# Patient Record
Sex: Female | Born: 1970 | Race: Black or African American | Hispanic: No | Marital: Married | Smoking: Never smoker
Health system: Southern US, Community
[De-identification: ages and names within clinical notes are randomized; demographics above are authoritative.]

## PROBLEM LIST (undated history)

## (undated) DIAGNOSIS — A6000 Herpesviral infection of urogenital system, unspecified: Secondary | ICD-10-CM

## (undated) DIAGNOSIS — D649 Anemia, unspecified: Secondary | ICD-10-CM

## (undated) DIAGNOSIS — I491 Atrial premature depolarization: Secondary | ICD-10-CM

## (undated) DIAGNOSIS — I10 Essential (primary) hypertension: Secondary | ICD-10-CM

## (undated) DIAGNOSIS — R7301 Impaired fasting glucose: Secondary | ICD-10-CM

## (undated) DIAGNOSIS — E785 Hyperlipidemia, unspecified: Secondary | ICD-10-CM

## (undated) DIAGNOSIS — E119 Type 2 diabetes mellitus without complications: Secondary | ICD-10-CM

## (undated) DIAGNOSIS — M419 Scoliosis, unspecified: Secondary | ICD-10-CM

## (undated) DIAGNOSIS — K219 Gastro-esophageal reflux disease without esophagitis: Secondary | ICD-10-CM

## (undated) HISTORY — PX: BACK SURGERY: SHX140

## (undated) HISTORY — DX: Hyperlipidemia, unspecified: E78.5

## (undated) HISTORY — DX: Essential (primary) hypertension: I10

## (undated) HISTORY — DX: Herpesviral infection of urogenital system, unspecified: A60.00

## (undated) HISTORY — DX: Atrial premature depolarization: I49.1

## (undated) HISTORY — DX: Morbid (severe) obesity due to excess calories: E66.01

## (undated) HISTORY — DX: Anemia, unspecified: D64.9

## (undated) HISTORY — DX: Impaired fasting glucose: R73.01

## (undated) HISTORY — DX: Scoliosis, unspecified: M41.9

## (undated) HISTORY — PX: COLONOSCOPY: SHX174

## (undated) HISTORY — PX: TUBAL LIGATION: SHX77

---

## 1999-01-09 ENCOUNTER — Other Ambulatory Visit: Admission: RE | Admit: 1999-01-09 | Discharge: 1999-01-09 | Payer: Self-pay | Admitting: *Deleted

## 1999-07-13 ENCOUNTER — Inpatient Hospital Stay (HOSPITAL_COMMUNITY): Admission: AD | Admit: 1999-07-13 | Discharge: 1999-07-13 | Payer: Self-pay | Admitting: Obstetrics & Gynecology

## 1999-07-21 ENCOUNTER — Encounter (INDEPENDENT_AMBULATORY_CARE_PROVIDER_SITE_OTHER): Payer: Self-pay

## 1999-07-21 ENCOUNTER — Inpatient Hospital Stay (HOSPITAL_COMMUNITY): Admission: AD | Admit: 1999-07-21 | Discharge: 1999-07-23 | Payer: Self-pay | Admitting: *Deleted

## 2000-11-18 ENCOUNTER — Emergency Department (HOSPITAL_COMMUNITY): Admission: EM | Admit: 2000-11-18 | Discharge: 2000-11-18 | Payer: Self-pay | Admitting: Emergency Medicine

## 2000-11-18 ENCOUNTER — Encounter: Payer: Self-pay | Admitting: Emergency Medicine

## 2001-02-06 ENCOUNTER — Encounter: Payer: Self-pay | Admitting: Emergency Medicine

## 2001-02-06 ENCOUNTER — Emergency Department (HOSPITAL_COMMUNITY): Admission: EM | Admit: 2001-02-06 | Discharge: 2001-02-06 | Payer: Self-pay

## 2001-10-16 ENCOUNTER — Other Ambulatory Visit: Admission: RE | Admit: 2001-10-16 | Discharge: 2001-10-16 | Payer: Self-pay | Admitting: Obstetrics and Gynecology

## 2002-10-22 ENCOUNTER — Other Ambulatory Visit: Admission: RE | Admit: 2002-10-22 | Discharge: 2002-10-22 | Payer: Self-pay | Admitting: *Deleted

## 2003-06-13 ENCOUNTER — Emergency Department (HOSPITAL_COMMUNITY): Admission: EM | Admit: 2003-06-13 | Discharge: 2003-06-13 | Payer: Self-pay | Admitting: Emergency Medicine

## 2004-08-18 ENCOUNTER — Emergency Department (HOSPITAL_COMMUNITY): Admission: EM | Admit: 2004-08-18 | Discharge: 2004-08-18 | Payer: Self-pay | Admitting: Emergency Medicine

## 2005-09-26 ENCOUNTER — Other Ambulatory Visit: Admission: RE | Admit: 2005-09-26 | Discharge: 2005-09-26 | Payer: Self-pay | Admitting: Obstetrics and Gynecology

## 2008-05-20 ENCOUNTER — Emergency Department (HOSPITAL_COMMUNITY): Admission: EM | Admit: 2008-05-20 | Discharge: 2008-05-20 | Payer: Self-pay | Admitting: *Deleted

## 2008-07-21 ENCOUNTER — Ambulatory Visit (HOSPITAL_BASED_OUTPATIENT_CLINIC_OR_DEPARTMENT_OTHER): Admission: RE | Admit: 2008-07-21 | Discharge: 2008-07-21 | Payer: Self-pay | Admitting: Family Medicine

## 2008-07-21 ENCOUNTER — Ambulatory Visit: Payer: Self-pay | Admitting: Radiology

## 2008-12-11 ENCOUNTER — Emergency Department (HOSPITAL_COMMUNITY): Admission: EM | Admit: 2008-12-11 | Discharge: 2008-12-11 | Payer: Self-pay | Admitting: Family Medicine

## 2010-12-01 NOTE — H&P (Signed)
[facility]  Patient:    Emily Fischer                    MRN: [id number] Adm. Date:  [id number] Attending:  Shaune Spittle Dictator:   Erin Sons, C.N.M.                         History and Physical  HISTORY OF PRESENT ILLNESS:   Ms. Crain is a 40 year old gravida 2, para 1-0-0-1 at 38-4/7 weeks, who presents with uterine contractions every five to seven minutes for approximately two hours.  She was seen at [facility] earlier today with the cervix 3 cm.  She reported increased uterine contractions once she left the office.  She denied any leaking or bleeding.  Upon admission to the Maternity Admissions Department, uterine contractions were every three to five minutes, mild-to-moderate quality, cervix was unchanged, fetal heart rate was reactive.  She was ambulated for approximately one to one and one-half hours. hen she returned from walking, her cervix had changed to 4 cm, 70%, vertex at a -1 o -2 station.  She was admitted for labor care per consult with Dr. Maris Berger. Haygood.  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  PRENATAL LABORATORY DATA:     Blood type is A-positive.  Rh-antibody negative.  VDRL nonreactive.  Rubella titer positive.  Hepatitis B surface antigen negative. HIV nonreactive.  Sickle cell test negative.  GC and Chlamydia cultures were negative.  Pap was normal.  Glucose challenge was normal.  AFP was normal. Group B strep culture at 36 weeks was negative.  EDC of July 31, 1999 was established by last menstrual period and was in agreement with ultrasound at approximately 17 weeks.  Hemoglobin upon entry into practice was 11.3; it was 10.2 at 27 weeks.   HISTORY OF PRESENT PREGNANCY:   Patient entered care at approximately 8 weeks. he had issues with heartburn throughout her pregnancy; she was on Zantac.  She desired a tubal ligation and spoke with Dr. Georgina Peer during her pregnancy regarding that.  She was decreased to working 30 hours per week at approximately 23 weeks  secondary to uterine contractions.  She had an ultrasound at 27 weeks secondary to size greater than dates.  Infant was in the 97th percentile.  Cervix was 3.5 cm and other findings were normal.  She was placed on iron at that time for 10.2 hemoglobin.  She had an ultrasound again at 36 weeks with estimated fetal weight in the 63rd percentile with normal AFI.  The rest of her pregnancy was essentially  uncomplicated.  OBSTETRICAL HISTORY:          In July of 1999, she had a vaginal birth of a female infant, weight 7 pounds 11 ounces, at 39-weeks gestation.  She was in labor approximately six hours.  She was induced at 39 weeks due to swelling and PIH. She was out of work at 35 to 36 weeks.  She had approximately 60-pound weight gain.  She had anemia during that pregnancy.  She also had postpartum depression following that pregnancy.  She did have an episode of preterm labor at 65 to 34 weeks and was treated with bedrest.  MEDICAL HISTORY:              She was on Loestrin for five years, from 66 to 19  years of age; she then took it also from August of 1999 till December of 1999 following the birth of her child.  She had an abnormal Pap in 1994, with a repeat that was normal.  She had her first outbreak of HSV in 1994 and that was the only outbreak she has ever had.  She reports the usual childhood illnesses.  Patient has a history of PACs and was treated with potassium and stress reduction and decreased caffeine; she has had no other complications.  She had a history of some superficial varicosities and has a history of chronic anemia.  She had scoliosis surgery in 1987.  She had  a UTI x 1 as a teenager.  Her only other hospitalization was for childbirth in 1999.  ALLERGIES:                    None.  FAMILY HISTORY:               Patients sister had PIH.  Her mother has hypertension.  Her paternal grandmother has hypertension.  Her ______ has hypertension.  Her sister has varicose veins.  Her maternal grandfather had some type of cancer.  Father of the baby has a previous history of cocaine use. Maternal uncle is a heroin abuser.  Sister abuses marijuana.  GENETIC HISTORY:              Genetic history is remarkable for the father of the baby having sickle cell trait, patients first child also has sickle cell trait nd the paternal father has sickle cell trait.  Patients ______ sisters child was born with too much protein in its body and no ability to fight it off; there is  some type of metabolic disease.  Father of the babys father is an alcoholic.  SOCIAL HISTORY:               Patient is married to the father of the baby; he s involved and supportive.  His name is Delight Stare, Montez Hageman.  She is African-American and of the [place] faith.  She has been followed by the certified nurse midwife service at [facility].  She denies any alcohol, drug or tobacco use during this pregnancy.  PHYSICAL EXAMINATION:  VITAL SIGNS:                  Vital signs are stable.  Patient is afebrile.  HEENT:                        Within normal limits.  LUNGS:                        Bilateral breath sounds are clear.  HEART:                        Regular rate and rhythm, without murmur.  BREASTS:                      Soft and nontender.  ABDOMEN:                      Fundal height is approximately 40 cm.  Estimated fetal weight is 8 to 8-1/2 pounds.  Uterine contractions are  every four minutes, mild-to-moderate quality.  PELVIC:                       Cervical exam 4 cm, 75%, vertex at a -1 to -2 station with bulging bag of water.  Fetal heart rate is  reactive.  EXTREMITIES:                  Deep tendon reflexes are 2+ without clonus. There is a trace edema noted.  IMPRESSION:                   1. Intrauterine pregnancy at 38-4/7 weeks.                               2. Early labor.                                3. History of herpes simplex virus -- no evidence of                                  outbreak.                               4. Plans bilateral tubal ligation.  PLAN:                         1. Admit to birthing suite per consult with                                  Dr. Maris Berger. Haygood as attending physician.                               2. Routine certified nurse midwife orders.                               3. Anticipate artificial rupture of membranes and                                  labor augmentation p.r.n.                               4. Patient desires tubal ligation postpartum. DD:  07/21/99 TD:  07/22/99 Job: [id number]

## 2011-07-18 LAB — HM PAP SMEAR

## 2011-11-23 ENCOUNTER — Encounter: Payer: Self-pay | Admitting: *Deleted

## 2012-11-06 ENCOUNTER — Ambulatory Visit (INDEPENDENT_AMBULATORY_CARE_PROVIDER_SITE_OTHER): Payer: 59 | Admitting: Family Medicine

## 2012-11-06 ENCOUNTER — Encounter: Payer: Self-pay | Admitting: Family Medicine

## 2012-11-06 VITALS — BP 156/97 | HR 66 | Ht 63.0 in | Wt 245.0 lb

## 2012-11-06 DIAGNOSIS — R300 Dysuria: Secondary | ICD-10-CM

## 2012-11-06 LAB — POCT URINALYSIS DIPSTICK
Bilirubin, UA: NEGATIVE
Blood, UA: NEGATIVE
Glucose, UA: NEGATIVE
Ketones, UA: NEGATIVE
Leukocytes, UA: NEGATIVE
Nitrite, UA: NEGATIVE
Protein, UA: NEGATIVE
Spec Grav, UA: 1.01
Urobilinogen, UA: NEGATIVE
pH, UA: 7

## 2012-11-06 MED ORDER — METRONIDAZOLE 500 MG PO TABS: 500.0000 mg | ORAL_TABLET | Freq: Three times a day (TID) | ORAL | Status: AC

## 2012-11-06 MED ORDER — METRONIDAZOLE 0.75 % VA GEL: 1.0000 | Freq: Two times a day (BID) | VAGINAL | Status: DC

## 2012-11-06 MED ORDER — FAMCICLOVIR 500 MG PO TABS: ORAL_TABLET | ORAL | Status: AC

## 2012-11-06 MED ORDER — FLUCONAZOLE 200 MG PO TABS: 200.0000 mg | ORAL_TABLET | Freq: Every day | ORAL | Status: DC

## 2012-11-06 MED ORDER — TERCONAZOLE 0.8 % VA CREA: 1.0000 | TOPICAL_CREAM | Freq: Every day | VAGINAL | Status: DC

## 2012-11-06 NOTE — Patient Instructions (Addendum)
1)  Vaginal Itching vs Irritation - If you are itching then you most likely have a yeast infection which you would treat with the Terazol and Diflucan.  If you are more irritated then you would treat for BV with Flagyl or Metrogel.  You can prevent this by eating yogurt (acidolphilus) and using boric acid suppositories vs boric acid douche (1-3 teaspoon per bottle)   2)  HSV 2 - Take 1/2 - 1 of the Valtrex daily for prevention then 3 at once of the Famvir for treatment.     Bacterial Vaginosis Bacterial vaginosis (BV) is a vaginal infection where the normal balance of bacteria in the vagina is disrupted. The normal balance is then replaced by an overgrowth of certain bacteria. There are several different kinds of bacteria that can cause BV. BV is the most common vaginal infection in women of childbearing age. CAUSES   The cause of BV is not fully understood. BV develops when there is an increase or imbalance of harmful bacteria.  Some activities or behaviors can upset the normal balance of bacteria in the vagina and put women at increased risk including:  Having a new sex partner or multiple sex partners.  Douching.  Using an intrauterine device (IUD) for contraception.  It is not clear what role sexual activity plays in the development of BV. However, women that have never had sexual intercourse are rarely infected with BV. Women do not get BV from toilet seats, bedding, swimming pools or from touching objects around them.  SYMPTOMS   Grey vaginal discharge.  A fish-like odor with discharge, especially after sexual intercourse.  Itching or burning of the vagina and vulva.  Burning or pain with urination.  Some women have no signs or symptoms at all. DIAGNOSIS  Your caregiver must examine the vagina for signs of BV. Your caregiver will perform lab tests and look at the sample of vaginal fluid through a microscope. They will look for bacteria and abnormal cells (clue cells), a pH test  higher than 4.5, and a positive amine test all associated with BV.  RISKS AND COMPLICATIONS   Pelvic inflammatory disease (PID).  Infections following gynecology surgery.  Developing HIV.  Developing herpes virus. TREATMENT  Sometimes BV will clear up without treatment. However, all women with symptoms of BV should be treated to avoid complications, especially if gynecology surgery is planned. Female partners generally do not need to be treated. However, BV may spread between female sex partners so treatment is helpful in preventing a recurrence of BV.   BV may be treated with antibiotics. The antibiotics come in either pill or vaginal cream forms. Either can be used with nonpregnant or pregnant women, but the recommended dosages differ. These antibiotics are not harmful to the baby.  BV can recur after treatment. If this happens, a second round of antibiotics will often be prescribed.  Treatment is important for pregnant women. If not treated, BV can cause a premature delivery, especially for a pregnant woman who had a premature birth in the past. All pregnant women who have symptoms of BV should be checked and treated.  For chronic reoccurrence of BV, treatment with a type of prescribed gel vaginally twice a week is helpful. HOME CARE INSTRUCTIONS   Finish all medication as directed by your caregiver.  Do not have sex until treatment is completed.  Tell your sexual partner that you have a vaginal infection. They should see their caregiver and be treated if they have problems, such  as a mild rash or itching.  Practice safe sex. Use condoms. Only have 1 sex partner. PREVENTION  Basic prevention steps can help reduce the risk of upsetting the natural balance of bacteria in the vagina and developing BV:  Do not have sexual intercourse (be abstinent).  Do not douche.  Use all of the medicine prescribed for treatment of BV, even if the signs and symptoms go away.  Tell your sex  partner if you have BV. That way, they can be treated, if needed, to prevent reoccurrence. SEEK MEDICAL CARE IF:   Your symptoms are not improving after 3 days of treatment.  You have increased discharge, pain, or fever. MAKE SURE YOU:   Understand these instructions.  Will watch your condition.  Will get help right away if you are not doing well or get worse. FOR MORE INFORMATION  Division of STD Prevention (DSTDP), Centers for Disease Control and Prevention: SolutionApps.[employer].za American Social Health Association (ASHA): www.ashastd.org  Document Released: 07/02/2005 Document Revised: 09/24/2011 Document Reviewed: 12/23/2008 [facility] Patient Information 2013 [place], [place].

## 2012-11-06 NOTE — Progress Notes (Signed)
Subjective:     Patient ID: Emily Fischer, female   DOB: 10/03/1970, 42 y.o.   MRN: [id number]  HPI Emily Fischer is here today complaining of vaginal irritation that she has been having for the past week.  She has thought that she was getting a herpes outbreak but her symptoms have persisted even though she has taken her Valtrex.  She also says that this irritation feels different than her previous herpes outbreaks.  She describes her symptoms as being moderate in nature and she is not improving.  She notices that she has increased discharge and an odor near the end of her period.    Review of Systems  Constitutional: Negative for fever.  Genitourinary: Positive for dysuria, vaginal discharge and vaginal pain. Negative for vaginal bleeding, difficulty urinating, genital sores, menstrual problem and dyspareunia.  Skin: Positive for rash.   Past Medical History  Diagnosis Date  . HTN (hypertension)   . Genital herpes   . Impaired fasting glucose   . Morbid obesity   . Hyperlipidemia   . Premature atrial contractions   . Anemia   . Scoliosis    Family History  Problem Relation Age of Onset  . Hypertension     . History   Social History Narrative   Married Chrissie Noa) "Bill"    Children:  Sons (LB, [name])    Pets: Dog Armed forces operational officer)    Living Situation: Lives with spouse and sons.     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 She appears distressed.  Abdominal: There is no tenderness.  Genitourinary: Pelvic exam was performed with patient supine. There is erythema and tenderness around the vagina. No bleeding around the vagina. No signs of injury around the vagina. Vaginal discharge found.   Musculoskeletal: She exhibits no edema and no tenderness.  Neurological: She is alert.  Psychiatric: She has a normal mood and affect.       Assessment:     Bacterial Vaginosisi HSV-2   Plan:     1)   We discussed the difference between yeast and BV and when she will use Metrogel vs Terazol and Diflucan. She is to use a boric acid douche toward the end of her period.    2)  HSV 2 - She will continue to take Valtrex for prevention and Famvir for treatment.

## 2012-11-09 ENCOUNTER — Encounter: Payer: Self-pay | Admitting: Family Medicine

## 2012-11-18 ENCOUNTER — Other Ambulatory Visit: Payer: Self-pay

## 2012-11-18 LAB — COMPLETE METABOLIC PANEL WITH GFR
ALT: 13 U/L (ref 0–35)
AST: 16 U/L (ref 0–37)
Albumin: 4.7 g/dL (ref 3.5–5.2)
Alkaline Phosphatase: 53 U/L (ref 39–117)
BUN: 8 mg/dL (ref 6–23)
CO2: 25 mEq/L (ref 19–32)
Calcium: 9.6 mg/dL (ref 8.4–10.5)
Chloride: 104 mEq/L (ref 96–112)
Creat: 0.66 mg/dL (ref 0.50–1.10)
GFR, Est African American: >89 mL/min
GFR, Est Non African American: >89 mL/min
Glucose, Bld: 100 mg/dL — ABNORMAL HIGH (ref 70–99)
Potassium: 4.7 mEq/L (ref 3.5–5.3)
Sodium: 137 mEq/L (ref 135–145)
Total Bilirubin: 0.5 mg/dL (ref 0.3–1.2)
Total Protein: 7.2 g/dL (ref 6.0–8.3)

## 2012-11-18 LAB — CBC WITH DIFFERENTIAL/PLATELET
Basophils Absolute: 0 10*3/uL (ref 0.0–0.1)
Basophils Relative: 0 % (ref 0–1)
Eosinophils Absolute: 0.1 10*3/uL (ref 0.0–0.7)
Eosinophils Relative: 2 % (ref 0–5)
HCT: 37.3 % (ref 36.0–46.0)
Hemoglobin: 11.8 g/dL — ABNORMAL LOW (ref 12.0–15.0)
Lymphocytes Relative: 28 % (ref 12–46)
Lymphs Abs: 1.5 10*3/uL (ref 0.7–4.0)
MCH: 23.7 pg — ABNORMAL LOW (ref 26.0–34.0)
MCHC: 31.6 g/dL (ref 30.0–36.0)
MCV: 74.9 fL — ABNORMAL LOW (ref 78.0–100.0)
Monocytes Absolute: 0.4 10*3/uL (ref 0.1–1.0)
Monocytes Relative: 8 % (ref 3–12)
Neutro Abs: 3.3 10*3/uL (ref 1.7–7.7)
Neutrophils Relative %: 62 % (ref 43–77)
Platelets: 328 10*3/uL (ref 150–400)
RBC: 4.98 MIL/uL (ref 3.87–5.11)
RDW: 16.7 % — ABNORMAL HIGH (ref 11.5–15.5)
WBC: 5.3 10*3/uL (ref 4.0–10.5)

## 2012-11-18 LAB — LIPID PANEL
Cholesterol: 192 mg/dL (ref 0–200)
HDL: 43 mg/dL (ref >39)
LDL Cholesterol: 135 mg/dL — ABNORMAL HIGH (ref 0–99)
Total CHOL/HDL Ratio: 4.5 Ratio
Triglycerides: 69 mg/dL (ref <150)
VLDL: 14 mg/dL (ref 0–40)

## 2012-11-18 LAB — HEMOGLOBIN A1C
Hgb A1c MFr Bld: 6 % — ABNORMAL HIGH (ref <5.7)
Mean Plasma Glucose: 126 mg/dL — ABNORMAL HIGH (ref <117)

## 2012-11-19 LAB — VITAMIN D 25 HYDROXY (VIT D DEFICIENCY, FRACTURES): Vit D, 25-Hydroxy: 35 ng/mL (ref 30–89)

## 2012-11-25 ENCOUNTER — Encounter: Payer: Self-pay | Admitting: Family Medicine

## 2012-11-25 ENCOUNTER — Ambulatory Visit (INDEPENDENT_AMBULATORY_CARE_PROVIDER_SITE_OTHER): Payer: 59 | Admitting: Family Medicine

## 2012-11-25 VITALS — BP 124/79 | HR 65 | Wt 249.0 lb

## 2012-11-25 DIAGNOSIS — I10 Essential (primary) hypertension: Secondary | ICD-10-CM | POA: Insufficient documentation

## 2012-11-25 DIAGNOSIS — E785 Hyperlipidemia, unspecified: Secondary | ICD-10-CM | POA: Insufficient documentation

## 2012-11-25 DIAGNOSIS — R7301 Impaired fasting glucose: Secondary | ICD-10-CM | POA: Insufficient documentation

## 2012-11-25 MED ORDER — ATENOLOL 100 MG PO TABS: 100.0000 mg | ORAL_TABLET | Freq: Every day | ORAL | Status: DC

## 2012-11-25 NOTE — Progress Notes (Signed)
Subjective:    Patient ID: Emily Fischer, female    DOB: 12/18/1970, 42 y.o.   MRN: [id number]  HPI  Emily Fischer is here to follow up on her labs, get medication refills and discuss the following conditions listed below:      1)  Blood Pressure:  She is doing well on her Atenolol. She has been getting this at a local pharmacy but would like to start getting it through her mail order ([facility]).    2)  Vaginal Irritation:  These symptoms have improved.   3)  Hyperlipidemia:  She does not have any complaint with Crestor.     4)  Blood Sugar:  She has not really been watching her diet very closely but plans to do much better over the next 6 months.     Review of Systems  Constitutional: Negative for activity change, fatigue and unexpected weight change.  HENT: Negative.   Eyes: Negative.   Respiratory: Negative for shortness of breath.   Cardiovascular: Negative for chest pain, palpitations and leg swelling.  Gastrointestinal: Negative for diarrhea and constipation.  Endocrine: Negative.   Genitourinary: Negative for difficulty urinating.  Musculoskeletal: Negative.   Skin: Negative.   Neurological: Negative.   Hematological: Negative for adenopathy. Does not bruise/bleed easily.  Psychiatric/Behavioral: Negative for sleep disturbance and dysphoric mood. The patient is not nervous/anxious.    Past Medical History  Diagnosis Date  . HTN (hypertension)   . Genital herpes   . Impaired fasting glucose   . Morbid obesity   . Hyperlipidemia   . Premature atrial contractions   . Anemia   . Scoliosis    Family History  Problem Relation Age of Onset  . Hypertension Mother   . Hyperlipidemia Mother   . Hypertension Sister   . Heart disease Maternal Grandmother   . Diabetes Paternal Grandmother    History   Social History Narrative   Married [name]    Children:  Sons (LB, [name])    Pets: Dog Armed forces operational officer)    Living Situation: Lives with spouse and sons.     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 No distress.  HENT:  Head: Normocephalic.  Eyes: No scleral icterus.  Neck: No thyromegaly present.  Cardiovascular: Normal rate, regular rhythm and normal heart sounds.   Pulmonary/Chest: Effort normal and breath sounds normal.  Abdominal: There is no tenderness.  Musculoskeletal: She exhibits no edema and no tenderness.  Neurological: She is alert.  Skin: Skin is warm and dry.  Psychiatric: She has a normal mood and affect. Her behavior is normal. Judgment and thought content normal.      Assessment & Plan:   1)  Hyperlipidemia - Her LDL is high (135).  She used to be on the Zetia but stopped it because she did not like how it made her feel.  We discussed having her try Welchol but she would like to hold off on taking another medication for now.    2)  Blood Pressure - Her pressure looked great today. She thinks that it is because she has just gotten back from a relaxful trip  to [facility].  A prescription was sent in to [facility].    3)  Blood Sugar - Her A1c is up a little.  She is going to work harder on her diet and exercise.    4)  Anemia - Her hemoglobin remains a little low.  She will try to be very consistent with taking her Hematinic Plus.    TIME 30 MINUTES:  MORE THAN 50 % OF TIME WAS INVOLVED IN COUNSELING.

## 2012-11-25 NOTE — Patient Instructions (Addendum)
Diet Following Bariatric Surgery The bariatric diet is designed to provide fluids and nourishment while promoting weight loss after bariatric surgery. The diet is divided into 3 stages. The rate of progression varies based on individual food tolerance. DIET FOLLOWING BARIATRIC SURGERY The diet following surgery is divided into 3 stages to allow a gradual adjustment. It is very important to the success of your surgery to:  Progress to each stage slowly.  Eat at set times.  Chew food well and stop eating when you are full.  Not drink liquids 30 minutes before and after meals. If you feel tightness or pressure in your chest, that means you are full. Wait 30 minutes before you try to eat again. STAGE 1 BARIATRIC DIET - ABOUT 2 WEEKS IN DURATION   The diet begins the day of surgery. It will last about 1 to 2 weeks after surgery. Your surgeon may have individual guidelines for you about specific foods or the progression of your diet. Follow your surgeon's guidelines.  If clear liquids are well-tolerated without vomiting, your caregiver will add a 4 oz to 6 oz high protein, low-calorie liquid supplement. You could add this to your meal plan 3 times daily. You will need at least 60 g to 80 g of protein daily or as determined by your Registered Dietitian.  Guidelines for choosing a protein supplement include:  At least 15 g of protein per 8 oz serving.  Less than 20 g total carbohydrate per 8 oz serving.  Less than 5 g fat per 8 oz serving.  Avoid carbonated beverages, caffeine, alcohol, and concentrated sweets such as sugar, cakes, and cookies.  Right after surgery, you may only be able to eat 3 to 4 tsp per meal. Your maximum volume should not exceed  to  cup total. Do not eat or drink more than 1 oz or 2 tbs every 15 minutes.  Take a chewable multivitamin and mineral supplement.  Drink at least 48 oz of fluid daily, which includes your protein supplement. Food and beverages from the  list below are allowed at set times (for example at 8 AM, 12 noon, or 5 PM):  Decaffeinated coffee or tea.  100% fruit juice.  Diet or sugar-free drinks.  Broth.  Blenderized soup.  Skim milk or lactose-free milk.  Sugar-free gelatin dessert or frozen ice pops.  Mashed potatoes.  Yogurt (artificially sweetened).  Sugar-free pudding.  Blended low-fat cottage cheese.  Unsweetened applesauce, grits, or hot wheat cereal. Four to six ounces of a liquid protein supplement from the list below is recommended for snacks at 10 AM, 2 PM, and 8 PM.  STAGE 2 BARIATRIC DIET (SOFT DIET) - ABOUT 4 WEEKS IN DURATION  About 2 weeks after surgery, your caregiver will progress your diet to this stage. Foods may need to be blended to the consistency of applesauce. Choose low-fat foods (less than 5 g of fat per serving) and avoid concentrated sweets and sugar (less than 10 g of sugar per serving). Meals should not exceed  to  cup total. This stage will last about 4 weeks. It is recommended that you meet with your dietitian at this stage to begin preparation for the last stage. This stage consists of 3 meals a day with a liquid protein supplement between meals twice daily. Do not drink liquids with foods. You must wait 30 minutes for the stomach pouch to empty before drinking. Chew food well. The food must be almost liquified before swallowing. Soft foods from the   list below can now be slowly added to your diet:  Soft fruit (soft canned fruit in light syrup or natural juice, banana, melon, peaches, pears, or strawberries).  Cooked vegetables.  Toast or crackers (becomes soft after chewing 20 times).  Hot wheat cereal.  Fish.  Eggs (scrambled, soft-boiled). STAGE 3 BARIATRIC DIET (REGULAR DIET) - ABOUT 6 to 8 WEEKS AFTER SURGERY About 6 to 8 weeks after surgery, you will be advanced to food that is regular in texture. This diet should include all food groups. The diet will continue to promote  weight loss. Meals should not exceed  to 1 cup total. Your dietitian will be available to assist you in meal planning and additional behavioral strategies to make this final stage a long-term success. Slowly add foods of regular consistency and remember:  Eat only at your chosen meal times.  Minimize drinking with meals. You should drink 30 minutes before eating. Do not start drinking again for about 2 hours after eating.  Chew food well. Take small bites.  Think about the portion size of a healthy frozen meal. You will be able to eat most of this.  Make sure your meal is balanced with starch, protein, fruits, and vegetables.  When you feel full, stop eating. Document Released: 01/06/2003 Document Revised: 09/24/2011 Document Reviewed: 09/29/2010 [employer] Patient Information 2013 [employer].  

## 2013-04-20 ENCOUNTER — Encounter: Payer: Self-pay | Admitting: Family Medicine

## 2013-04-20 ENCOUNTER — Ambulatory Visit (INDEPENDENT_AMBULATORY_CARE_PROVIDER_SITE_OTHER): Payer: 59 | Admitting: Family Medicine

## 2013-04-20 VITALS — BP 132/83 | HR 75 | Resp 16 | Ht 63.0 in | Wt 250.0 lb

## 2013-04-20 DIAGNOSIS — I10 Essential (primary) hypertension: Secondary | ICD-10-CM

## 2013-04-20 DIAGNOSIS — Z23 Encounter for immunization: Secondary | ICD-10-CM | POA: Insufficient documentation

## 2013-04-20 DIAGNOSIS — E785 Hyperlipidemia, unspecified: Secondary | ICD-10-CM

## 2013-04-20 DIAGNOSIS — A6000 Herpesviral infection of urogenital system, unspecified: Secondary | ICD-10-CM

## 2013-04-20 DIAGNOSIS — D649 Anemia, unspecified: Secondary | ICD-10-CM | POA: Insufficient documentation

## 2013-04-20 MED ORDER — HEMATINIC PLUS VIT/MINERALS 106-1 MG PO TABS: 1.0000 | ORAL_TABLET | Freq: Every day | ORAL | Status: DC

## 2013-04-20 MED ORDER — ATENOLOL 100 MG PO TABS: 100.0000 mg | ORAL_TABLET | Freq: Every day | ORAL | Status: DC

## 2013-04-20 MED ORDER — ROSUVASTATIN CALCIUM 40 MG PO TABS: 40.0000 mg | ORAL_TABLET | Freq: Every day | ORAL | Status: DC

## 2013-04-20 MED ORDER — VALACYCLOVIR HCL 1 G PO TABS: 1000.0000 mg | ORAL_TABLET | Freq: Every day | ORAL | Status: DC

## 2013-04-20 NOTE — Progress Notes (Signed)
  Subjective:    Patient ID: Emily Fischer, female    DOB: 06/06/1971, 42 y.o.   MRN: [id number]  HPI  Emily Fischer is here today to discuss the conditions listed below.    1)  Hypertension:  She has been taking her atenolol 100 mg.  She needs a refill on it.   2)  Hyperlipidemia:  She has done well with her Crestor (1/2 of the 40 mg) and needs a refill on it.   3)  HSV:  She takes Valtrex daily for prevention.  She needs a refill on it.    4)  Anemia:  She is doing well on the Hemocyte Plus and needs a refill on it.    Review of Systems  Constitutional: Negative.   HENT: Negative.   Eyes: Negative.   Respiratory: Negative.   Cardiovascular: Negative.   Gastrointestinal: Negative.   Endocrine: Negative.   Genitourinary: Negative.   Musculoskeletal: Negative.   Skin: Negative.   Allergic/Immunologic: Negative.   Neurological: Negative.   Hematological: Negative.   Psychiatric/Behavioral: Negative.     Past Medical History  Diagnosis Date  . HTN (hypertension)   . Genital herpes   . Impaired fasting glucose   . Morbid obesity   . Hyperlipidemia   . Premature atrial contractions   . Anemia   . Scoliosis     Family History  Problem Relation Age of Onset  . Hypertension Mother   . Hyperlipidemia Mother   . Hypertension Sister   . Heart disease Maternal Grandmother   . Diabetes Paternal Grandmother     History   Social History Narrative   Married Emily Fischer) "Optometrist"    Children:  Sons (Emily Fischer, Emily Fischer)    Pets: Dog Armed forces operational officer)    Living Situation: Lives with spouse and sons.     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 Constitutional: She appears well-developed and well-nourished.  Cardiovascular: Normal rate.    Pulmonary/Chest: Effort normal and breath sounds normal.  Skin: Skin is warm and dry.  Psychiatric: She has a normal mood and affect.      Assessment & Plan:

## 2013-04-20 NOTE — Patient Instructions (Signed)
1)  Medications - Continue on medications at current dosages.  We'll recheck an A1c at your return visit.

## 2013-04-20 NOTE — Assessment & Plan Note (Signed)
Refilled her Crestor.   

## 2013-04-20 NOTE — Assessment & Plan Note (Signed)
Her Hemocyte was refilled.

## 2013-04-20 NOTE — Assessment & Plan Note (Signed)
Refilled her atenolol.

## 2013-04-20 NOTE — Assessment & Plan Note (Signed)
Refilled her Valtrex 

## 2013-04-20 NOTE — Assessment & Plan Note (Signed)
The patient confirmed that they are not allergic to eggs and have never had a bad reaction with the flu shot in the past.  The vaccination was given without difficulty.   

## 2013-05-11 ENCOUNTER — Encounter: Payer: Self-pay | Admitting: *Deleted

## 2013-11-09 ENCOUNTER — Other Ambulatory Visit: Payer: Self-pay | Admitting: *Deleted

## 2013-11-09 DIAGNOSIS — R5381 Other malaise: Secondary | ICD-10-CM

## 2013-11-09 DIAGNOSIS — E785 Hyperlipidemia, unspecified: Secondary | ICD-10-CM

## 2013-11-09 DIAGNOSIS — R7301 Impaired fasting glucose: Secondary | ICD-10-CM

## 2013-11-09 DIAGNOSIS — R5383 Other fatigue: Principal | ICD-10-CM

## 2013-11-12 ENCOUNTER — Other Ambulatory Visit: Payer: 59

## 2013-11-13 LAB — CBC WITH DIFFERENTIAL/PLATELET
Basophils Absolute: 0 10*3/uL (ref 0.0–0.1)
Basophils Relative: 1 % (ref 0–1)
Eosinophils Absolute: 0.1 10*3/uL (ref 0.0–0.7)
Eosinophils Relative: 2 % (ref 0–5)
HCT: 35.9 % — ABNORMAL LOW (ref 36.0–46.0)
Hemoglobin: 11.4 g/dL — ABNORMAL LOW (ref 12.0–15.0)
Lymphocytes Relative: 25 % (ref 12–46)
Lymphs Abs: 1.1 10*3/uL (ref 0.7–4.0)
MCH: 23.4 pg — ABNORMAL LOW (ref 26.0–34.0)
MCHC: 31.8 g/dL (ref 30.0–36.0)
MCV: 73.7 fL — ABNORMAL LOW (ref 78.0–100.0)
Monocytes Absolute: 0.4 10*3/uL (ref 0.1–1.0)
Monocytes Relative: 8 % (ref 3–12)
Neutro Abs: 2.8 10*3/uL (ref 1.7–7.7)
Neutrophils Relative %: 64 % (ref 43–77)
Platelets: 330 10*3/uL (ref 150–400)
RBC: 4.87 MIL/uL (ref 3.87–5.11)
RDW: 16.3 % — ABNORMAL HIGH (ref 11.5–15.5)
WBC: 4.4 10*3/uL (ref 4.0–10.5)

## 2013-11-13 LAB — COMPLETE METABOLIC PANEL WITH GFR
ALT: 14 U/L (ref 0–35)
AST: 19 U/L (ref 0–37)
Albumin: 4.7 g/dL (ref 3.5–5.2)
Alkaline Phosphatase: 43 U/L (ref 39–117)
BUN: 10 mg/dL (ref 6–23)
CO2: 26 mEq/L (ref 19–32)
Calcium: 9.8 mg/dL (ref 8.4–10.5)
Chloride: 101 mEq/L (ref 96–112)
Creat: 0.64 mg/dL (ref 0.50–1.10)
GFR, Est African American: >89 mL/min
GFR, Est Non African American: >89 mL/min
Glucose, Bld: 113 mg/dL — ABNORMAL HIGH (ref 70–99)
Potassium: 4.2 mEq/L (ref 3.5–5.3)
Sodium: 137 mEq/L (ref 135–145)
Total Bilirubin: 0.5 mg/dL (ref 0.2–1.2)
Total Protein: 7.1 g/dL (ref 6.0–8.3)

## 2013-11-13 LAB — TSH: TSH: 0.742 u[IU]/mL (ref 0.350–4.500)

## 2013-11-13 LAB — HEMOGLOBIN A1C
Hgb A1c MFr Bld: 6.2 % — ABNORMAL HIGH (ref <5.7)
Mean Plasma Glucose: 131 mg/dL — ABNORMAL HIGH (ref <117)

## 2013-11-13 LAB — LIPID PANEL
Cholesterol: 182 mg/dL (ref 0–200)
HDL: 41 mg/dL (ref >39)
LDL Cholesterol: 131 mg/dL — ABNORMAL HIGH (ref 0–99)
Total CHOL/HDL Ratio: 4.4 Ratio
Triglycerides: 52 mg/dL (ref <150)
VLDL: 10 mg/dL (ref 0–40)

## 2013-11-19 ENCOUNTER — Encounter: Payer: Self-pay | Admitting: Family Medicine

## 2013-11-19 ENCOUNTER — Ambulatory Visit (INDEPENDENT_AMBULATORY_CARE_PROVIDER_SITE_OTHER): Payer: 59 | Admitting: Family Medicine

## 2013-11-19 VITALS — BP 149/86 | HR 86 | Resp 16 | Ht 63.0 in | Wt 238.0 lb

## 2013-11-19 DIAGNOSIS — R7301 Impaired fasting glucose: Secondary | ICD-10-CM

## 2013-11-19 DIAGNOSIS — A6000 Herpesviral infection of urogenital system, unspecified: Secondary | ICD-10-CM

## 2013-11-19 DIAGNOSIS — I10 Essential (primary) hypertension: Secondary | ICD-10-CM

## 2013-11-19 DIAGNOSIS — B009 Herpesviral infection, unspecified: Secondary | ICD-10-CM

## 2013-11-19 DIAGNOSIS — D509 Iron deficiency anemia, unspecified: Secondary | ICD-10-CM

## 2013-11-19 DIAGNOSIS — E785 Hyperlipidemia, unspecified: Secondary | ICD-10-CM

## 2013-11-19 MED ORDER — POLYSACCHARIDE IRON COMPLEX 150 MG PO CAPS: 150.0000 mg | ORAL_CAPSULE | Freq: Every day | ORAL | Status: AC

## 2013-11-19 MED ORDER — FAMCICLOVIR 500 MG PO TABS: ORAL_TABLET | ORAL | Status: AC

## 2013-11-19 NOTE — Progress Notes (Signed)
Subjective:    Patient ID: Emily Fischer, female    DOB: 02/04/1971, 43 y.o.   MRN: [id number]  HPI  Emily Fischer is here today to discuss most recent labs and the conditions listed below.:  1)  Hypertension: She has been taking her atenolol 100 mg. She needs a refill on it .   2   Hyperlipidemia: She has done well with her Crestor (1/2 of the 40 mg) and needs a refill on it.    3) IFG:  Her sugars continue to creep up. Her A1c is 6.2   4)  HSV: She takes Valtrex daily for prevention. She needs a refill on it.    5)  Painful Periods - She would like to talk about her periods.  She feels they are getting worse.  She complains of cramping for the first time last month.      Review of Systems  Constitutional: Negative for activity change, fatigue and unexpected weight change.  HENT: Negative.   Eyes: Negative.   Respiratory: Negative for shortness of breath.   Cardiovascular: Negative for chest pain, palpitations and leg swelling.  Gastrointestinal: Negative for diarrhea and constipation.  Endocrine: Negative for polydipsia, polyphagia and polyuria.  Genitourinary: Negative for difficulty urinating.  Musculoskeletal: Negative.   Skin: Negative.   Neurological: Negative.   Hematological: Negative for adenopathy. Does not bruise/bleed easily.  Psychiatric/Behavioral: Negative for sleep disturbance and dysphoric mood. The patient is not nervous/anxious.      Past Medical History  Diagnosis Date  . HTN (hypertension)   . Genital herpes   . Impaired fasting glucose   . Morbid obesity   . Hyperlipidemia   . Premature atrial contractions   . Anemia   . Scoliosis      Past Surgical History  Procedure Laterality Date  . Tubal ligation    . 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 Hypertension Mother   . Hyperlipidemia Mother   . Hypertension Sister   . Heart disease Maternal Grandmother   . Diabetes Paternal Grandmother      Current Outpatient Prescriptions on File Prior to Visit  Medication Sig Dispense Refill  . atenolol (TENORMIN) 100 MG tablet Take 1 tablet (100 mg total) by mouth daily.  90 tablet  3  . rosuvastatin (CRESTOR) 40 MG tablet Take 1 tablet (40 mg total) by mouth daily.  90 tablet  1  . valACYclovir (VALTREX) 1000 MG tablet Take 1 tablet (1,000 mg total) by mouth daily.  90 tablet  3   No current facility-administered medications on file prior to visit.     No Known Allergies   Immunization History  Administered Date(s) Administered  . Influenza,inj,Quad PF,36+ Mos 04/20/2013  . Tdap 11/21/2009       Objective:   Physical Exam  Nursing note and vitals reviewed. Constitutional:  She is oriented to person, place, and time. She appears well-developed and well-nourished.  Eyes: Conjunctivae are normal. No scleral icterus.  Neck: Neck supple. No thyromegaly present.  Cardiovascular: Normal rate, regular rhythm and normal heart sounds.   Pulmonary/Chest: Effort normal and breath sounds normal.  Musculoskeletal: She exhibits no edema and no tenderness.  Lymphadenopathy:    She has no cervical adenopathy.  Neurological: She is alert and oriented to person, place, and time.  Skin: Skin is warm and dry.  Psychiatric: She has a normal mood and affect. Her behavior is normal. Judgment and thought content normal.       Assessment & Plan:   Emily Fischer was seen today for medication refill.  Diagnoses and associated orders for this visit:  Essential  hypertension, benign - atenolol (TENORMIN) 100 MG tablet; Take 1 tablet (100 mg total) by mouth daily.  Other and unspecified hyperlipidemia - rosuvastatin (CRESTOR) 40 MG tablet; Take 1 tablet (40 mg total) by mouth daily.  Impaired fasting glucose  HSV-2 infection - famciclovir (FAMVIR) 500 MG tablet; Take 3 tabs at onset of symptoms x 1 day  Anemia - iron polysaccharides (NU-IRON) 150 MG capsule; Take 1 capsule (150 mg total) by mouth daily.  Genital herpes - valACYclovir (VALTREX) 1000 MG tablet; Take 1 tablet (1,000 mg total) by mouth daily.

## 2014-02-06 DIAGNOSIS — B009 Herpesviral infection, unspecified: Secondary | ICD-10-CM | POA: Insufficient documentation

## 2014-02-06 DIAGNOSIS — R7301 Impaired fasting glucose: Secondary | ICD-10-CM | POA: Insufficient documentation

## 2014-02-06 MED ORDER — VALACYCLOVIR HCL 1 G PO TABS: 1000.0000 mg | ORAL_TABLET | Freq: Every day | ORAL | Status: AC

## 2014-02-06 MED ORDER — ATENOLOL 100 MG PO TABS: 100.0000 mg | ORAL_TABLET | Freq: Every day | ORAL | Status: AC

## 2014-02-06 MED ORDER — ROSUVASTATIN CALCIUM 40 MG PO TABS: 40.0000 mg | ORAL_TABLET | Freq: Every day | ORAL | Status: AC

## 2014-02-18 ENCOUNTER — Other Ambulatory Visit: Payer: 59 | Admitting: Family Medicine

## 2014-08-30 ENCOUNTER — Ambulatory Visit (INDEPENDENT_AMBULATORY_CARE_PROVIDER_SITE_OTHER): Payer: 59 | Admitting: Internal Medicine

## 2014-08-30 VITALS — BP 142/98 | HR 81 | Temp 98.0°F | Resp 18 | Ht 65.5 in | Wt 245.0 lb

## 2014-08-30 DIAGNOSIS — S161XXA Strain of muscle, fascia and tendon at neck level, initial encounter: Secondary | ICD-10-CM

## 2014-08-30 DIAGNOSIS — H9201 Otalgia, right ear: Secondary | ICD-10-CM

## 2014-08-30 MED ORDER — IBUPROFEN 600 MG PO TABS: 600.0000 mg | ORAL_TABLET | Freq: Three times a day (TID) | ORAL | Status: AC | PRN

## 2014-08-30 MED ORDER — METHOCARBAMOL 750 MG PO TABS: 750.0000 mg | ORAL_TABLET | Freq: Four times a day (QID) | ORAL | Status: DC

## 2014-08-30 NOTE — Progress Notes (Signed)
   Subjective:    Patient ID: Emily Fischer, female    DOB: 11/10/1970, 44 y.o.   MRN: [id number]  HPI Right ear, face, and neck tender. Wears ear piece at work, and worsens pain. No other trauma.   Review of Systems     Objective:   Physical Exam  Constitutional: She is oriented to person, place, and time. She appears well-developed and well-nourished. No distress.  HENT:  Head: Normocephalic.  Right Ear: External ear normal.  Left Ear: External ear normal.  Nose: Nose normal.  Eyes: Conjunctivae and EOM are normal. Pupils are equal, round, and reactive to light.  Neck: Normal range of motion. No tracheal deviation present.  Pulmonary/Chest: Effort normal.  Musculoskeletal:       Cervical back: She exhibits tenderness, pain and spasm. She exhibits normal range of motion, no bony tenderness, no swelling, no edema, no deformity and normal pulse.       Back:  Lymphadenopathy:    She has no cervical adenopathy.  Neurological: She is alert and oriented to person, place, and time. She exhibits abnormal muscle tone. Coordination normal.  Skin: Skin is intact. No rash noted.     Psychiatric: She has a normal mood and affect. Her behavior is normal. Thought content normal.          Assessment & Plan:  Right otalgia/neck pain and spasm Robaxin 750mg /motrin 600mg  Neck manual

## 2014-08-30 NOTE — Patient Instructions (Signed)
Otalgia  The most common reason for this in children is an infection of the middle ear. Pain from the middle ear is usually caused by a build-up of fluid and pressure behind the eardrum. Pain from an earache can be sharp, dull, or burning. The pain may be temporary or constant. The middle ear is connected to the nasal passages by a short narrow tube called the Eustachian tube. The Eustachian tube allows fluid to drain out of the middle ear, and helps keep the pressure in your ear equalized.  CAUSES   A cold or allergy can block the Eustachian tube with inflammation and the build-up of secretions. This is especially likely in small children, because their Eustachian tube is shorter and more horizontal. When the Eustachian tube closes, the normal flow of fluid from the middle ear is stopped. Fluid can accumulate and cause stuffiness, pain, hearing loss, and an ear infection if germs start growing in this area.  SYMPTOMS   The symptoms of an ear infection may include fever, ear pain, fussiness, increased crying, and irritability. Many children will have temporary and minor hearing loss during and right after an ear infection. Permanent hearing loss is rare, but the risk increases the more infections a child has. Other causes of ear pain include retained water in the outer ear canal from swimming and bathing.  Ear pain in adults is less likely to be from an ear infection. Ear pain may be referred from other locations. Referred pain may be from the joint between your jaw and the skull. It may also come from a tooth problem or problems in the neck. Other causes of ear pain include:   A foreign body in the ear.   Outer ear infection.   Sinus infections.   Impacted ear wax.   Ear injury.   Arthritis of the jaw or TMJ problems.   Middle ear infection.   Tooth infections.   Sore throat with pain to the ears.  DIAGNOSIS   Your caregiver can usually make the diagnosis by examining you. Sometimes other special studies,  including x-rays and lab work may be necessary.  TREATMENT    If antibiotics were prescribed, use them as directed and finish them even if you or your child's symptoms seem to be improved.   Sometimes PE tubes are needed in children. These are little plastic tubes which are put into the eardrum during a simple surgical procedure. They allow fluid to drain easier and allow the pressure in the middle ear to equalize. This helps relieve the ear pain caused by pressure changes.  HOME CARE INSTRUCTIONS    Only take over-the-counter or prescription medicines for pain, discomfort, or fever as directed by your caregiver. DO NOT GIVE CHILDREN ASPIRIN because of the association of Reye's Syndrome in children taking aspirin.   Use a cold pack applied to the outer ear for 15-20 minutes, 03-04 times per day or as needed may reduce pain. Do not apply ice directly to the skin. You may cause frost bite.   Over-the-counter ear drops used as directed may be effective. Your caregiver may sometimes prescribe ear drops.   Resting in an upright position may help reduce pressure in the middle ear and relieve pain.   Ear pain caused by rapidly descending from high altitudes can be relieved by swallowing or chewing gum. Allowing infants to suck on a bottle during airplane travel can help.   Do not smoke in the house or near children. If you are   unable to quit smoking, smoke outside.   Control allergies.  SEEK IMMEDIATE MEDICAL CARE IF:    You or your child are becoming sicker.   Pain or fever relief is not obtained with medicine.   You or your child's symptoms (pain, fever, or irritability) do not improve within 24 to 48 hours or as instructed.   Severe pain suddenly stops hurting. This may indicate a ruptured eardrum.   You or your children develop new problems such as severe headaches, stiff neck, difficulty swallowing, or swelling of the face or around the ear.  Document Released: 02/17/2004 Document Revised: 09/24/2011  Document Reviewed: 06/23/2008  Exit[employer] Patient Information 2015 [employer]. This information is not intended to replace advice given to you by your health care provider. Make sure you discuss any questions you have with your health care provider.

## 2015-08-05 ENCOUNTER — Emergency Department (HOSPITAL_COMMUNITY): Payer: 59

## 2015-08-05 ENCOUNTER — Encounter (HOSPITAL_COMMUNITY): Payer: Self-pay | Admitting: Family Medicine

## 2015-08-05 ENCOUNTER — Emergency Department (HOSPITAL_COMMUNITY)
Admission: EM | Admit: 2015-08-05 | Discharge: 2015-08-05 | Disposition: A | Discharge status: Home / Self Care | Payer: 59 | Attending: Emergency Medicine | Admitting: Emergency Medicine

## 2015-08-05 DIAGNOSIS — R079 Chest pain, unspecified: Secondary | ICD-10-CM | POA: Insufficient documentation

## 2015-08-05 DIAGNOSIS — Z8739 Personal history of other diseases of the musculoskeletal system and connective tissue: Secondary | ICD-10-CM | POA: Diagnosis not present

## 2015-08-05 DIAGNOSIS — Z8619 Personal history of other infectious and parasitic diseases: Secondary | ICD-10-CM | POA: Insufficient documentation

## 2015-08-05 DIAGNOSIS — Z79899 Other long term (current) drug therapy: Secondary | ICD-10-CM | POA: Insufficient documentation

## 2015-08-05 DIAGNOSIS — E785 Hyperlipidemia, unspecified: Secondary | ICD-10-CM | POA: Diagnosis not present

## 2015-08-05 DIAGNOSIS — D649 Anemia, unspecified: Secondary | ICD-10-CM | POA: Diagnosis not present

## 2015-08-05 DIAGNOSIS — I10 Essential (primary) hypertension: Secondary | ICD-10-CM | POA: Insufficient documentation

## 2015-08-05 LAB — I-STAT BETA HCG BLOOD, ED (MC, WL, AP ONLY): I-stat hCG, quantitative: <5 m[IU]/mL (ref <5)

## 2015-08-05 LAB — CBC
HCT: 39.9 % (ref 36.0–46.0)
HEMOGLOBIN: 12.7 g/dL (ref 12.0–15.0)
MCH: 24.1 pg — AB (ref 26.0–34.0)
MCHC: 31.8 g/dL (ref 30.0–36.0)
MCV: 75.7 fL — AB (ref 78.0–100.0)
PLATELETS: 311 10*3/uL (ref 150–400)
RBC: 5.27 MIL/uL — AB (ref 3.87–5.11)
RDW: 14.9 % (ref 11.5–15.5)
WBC: 5 10*3/uL (ref 4.0–10.5)

## 2015-08-05 LAB — BASIC METABOLIC PANEL
Anion gap: 11 (ref 5–15)
BUN: 7 mg/dL (ref 6–20)
CALCIUM: 9.6 mg/dL (ref 8.9–10.3)
CHLORIDE: 105 mmol/L (ref 101–111)
CO2: 24 mmol/L (ref 22–32)
CREATININE: 0.62 mg/dL (ref 0.44–1.00)
GFR calc Af Amer: >60 mL/min (ref >60)
GFR calc non Af Amer: >60 mL/min (ref >60)
GLUCOSE: 119 mg/dL — AB (ref 65–99)
Potassium: 4.3 mmol/L (ref 3.5–5.1)
Sodium: 140 mmol/L (ref 135–145)

## 2015-08-05 LAB — I-STAT TROPONIN, ED
TROPONIN I, POC: 0 ng/mL (ref 0.00–0.08)
TROPONIN I, POC: 0 ng/mL (ref 0.00–0.08)

## 2015-08-05 MED ORDER — ASPIRIN 81 MG PO CHEW
324.0000 mg | CHEWABLE_TABLET | Freq: Once | ORAL | Status: AC
Start: 2015-08-05 — End: 2015-08-05
  Administered 2015-08-05: 324 mg via ORAL
  Filled 2015-08-05: qty 4

## 2015-08-05 NOTE — ED Notes (Signed)
EKG exporting delayed due to equipment malfunction.

## 2015-08-05 NOTE — ED Notes (Signed)
Patient transported to X-ray 

## 2015-08-05 NOTE — ED Provider Notes (Signed)
CSN: [id number]     Arrival date & time 08/05/15  7846 History   First MD Initiated Contact with Patient 08/05/15 0801     Chief Complaint  Patient presents with  . Chest Pain     (Consider location/radiation/quality/duration/timing/severity/associated sxs/prior Treatment) HPI Comments: 45 year old female with past medical history including hypertension, hyperlipidemia who presents with chest pain. Patient states that this morning she was awakened from sleep by a central, nonradiating chest pressure associated w/ feeling like she couldn't catch her breath. She stated she felt like she needed some air so she got on her treadmill and began walking which improved her symptoms. She currently feels better and rates her chest pressure as 1/10 in intensity. She denies any associated nausea, vomiting, diaphoresis, or lightheadedness. No fevers, cough/cold symptoms, or recent illness. She has never had this pain before. No recent travel, OCP use, or history of blood clots. Family history notable for maternal grandmother and aunts and uncles with MI. No immediate family history of heart disease or blood clot.  Patient is a 45 y.o. female presenting with chest pain. The history is provided by the patient.  Chest Pain   Past Medical History  Diagnosis Date  . HTN (hypertension)   . Genital herpes   . Impaired fasting glucose   . Morbid obesity (HCC)   . Hyperlipidemia   . Premature atrial contractions   . Anemia   . Scoliosis    Past Surgical History  Procedure Laterality Date  . Tubal ligation    . Back surgery      scoliosis   Family History  Problem Relation Age of Onset  . Hypertension Mother   . Hyperlipidemia Mother   . Hypertension Sister   . Heart disease Maternal Grandmother   . Diabetes Paternal Grandmother    Social History  Substance Use Topics  . Smoking status: Never Smoker   . Smokeless tobacco: Never Used  . Alcohol Use: Yes     Comment: Occasional   OB History    No data available     Review of Systems  Cardiovascular: Positive for chest pain.   10 Systems reviewed and are negative for acute change except as noted in the HPI.    Allergies  Sulfur  Home Medications   Prior to Admission medications   Medication Sig Start Date End Date Taking? Authorizing Provider  atenolol (TENORMIN) 100 MG tablet Take 1 tablet (100 mg total) by mouth daily. Patient taking differently: Take 50 mg by mouth daily.  02/06/14 08/05/15 Yes Gillian Scarce, MD  Cholecalciferol (VITAMIN D3) 5000 units CAPS Take 1 capsule by mouth daily.   Yes Historical Provider, MD  iron polysaccharides (NU-IRON) 150 MG capsule Take 1 capsule (150 mg total) by mouth daily. 11/19/13 08/05/15 Yes Gillian Scarce, MD  naproxen sodium (ANAPROX) 220 MG tablet Take 220 mg by mouth 2 (two) times daily as needed (pain).   Yes Historical Provider, MD  rosuvastatin (CRESTOR) 40 MG tablet Take 1 tablet (40 mg total) by mouth daily. Patient taking differently: Take 20 mg by mouth daily.  02/06/14 08/05/15 Yes Gillian Scarce, MD  valACYclovir (VALTREX) 1000 MG tablet Take 500 mg by mouth every evening.   Yes Historical Provider, MD  ibuprofen (ADVIL,MOTRIN) 600 MG tablet Take 1 tablet (600 mg total) by mouth every 8 (eight) hours as needed. Patient not taking: Reported on 08/05/2015 08/30/14   Jonita Albee, MD  methocarbamol (ROBAXIN-750) 750 MG tablet Take 1 tablet (750  mg total) by mouth 4 (four) times daily. Patient not taking: Reported on 08/05/2015 08/30/14   Ashley Jacobs Guest, MD   BP 137/73 mmHg  Pulse 62  Temp(Src) 98.4 F (36.9 C) (Oral)  Resp 18  SpO2 100%  LMP 08/01/2015 Physical Exam  Constitutional: She is oriented to person, place, and time. She appears well-developed and well-nourished. No distress.  HENT:  Head: Normocephalic and atraumatic.  Moist mucous membranes  Eyes: Conjunctivae are normal. Pupils are equal, round, and reactive to light.  Neck: Neck supple.  Cardiovascular:  Normal rate, regular rhythm and normal heart sounds.   No murmur heard. Pulmonary/Chest: Effort normal and breath sounds normal.  Abdominal: Soft. Bowel sounds are normal. She exhibits no distension. There is no tenderness.  Musculoskeletal: She exhibits no edema.  Neurological: She is alert and oriented to person, place, and time.  Fluent speech  Skin: Skin is warm and dry.  Psychiatric: She has a normal mood and affect. Judgment normal.  pleasant  Nursing note and vitals reviewed.   ED Course  Procedures (including critical care time) Labs Review Labs Reviewed  BASIC METABOLIC PANEL - Abnormal; Notable for the following:    Glucose, Bld 119 (*)    All other components within normal limits  CBC - Abnormal; Notable for the following:    RBC 5.27 (*)    MCV 75.7 (*)    MCH 24.1 (*)    All other components within normal limits  I-STAT TROPOININ, ED  I-STAT BETA HCG BLOOD, ED ([facility], [facility], [facility] ONLY)  I-STAT TROPOININ, ED    Imaging Review Dg Chest 2 View  08/05/2015  CLINICAL DATA:  Chest pain EXAM: CHEST  2 VIEW COMPARISON:  07/21/2008 FINDINGS: Borderline cardiomegaly, with size likely accentuated by scoliotic curvature. Normal aortic and hilar contours. There is no edema, consolidation, effusion, or pneumothorax. Thoracic scoliosis status post fixation. IMPRESSION: Borderline cardiomegaly. Electronically Signed   By: Marnee Spring M.D.   On: 08/05/2015 08:43   I have personally reviewed and evaluated these lab results as part of my medical decision-making.   EKG Interpretation   Date/Time:  Friday August 05 2015 08:05:46 EST Ventricular Rate:  80 PR Interval:  184 QRS Duration: 88 QT Interval:  385 QTC Calculation: 444 R Axis:   27 Text Interpretation:  Age not entered, assumed to be  45 years old for  purpose of ECG interpretation Sinus rhythm Baseline wander in lead(s) V1  V2 No significant change since last tracing Confirmed by Anchor Dwan MD, Darianny Momon  [phone]) on 08/05/2015  8:21:02 AM      MDM   Final diagnoses:  Chest pain, unspecified chest pain type   This is a pleasant 45 year old female who presents with chest pressure associated with shortness of breath that woke her up from sleep and improved after she walked on a treadmill. On arrival, patient was well-appearing and in no acute distress. She was hypertensive at 181/98. EKG without ischemic changes. Obtained above lab work as well as chest x-ray. Chest x-ray showed borderline cardiomegaly but no other abnormalities.  Patient is PERC negative thus PE very unlikely. Serial troponins negative and patient comfortable on reexamination. Her HEART score is <3 therefore I feel she is safe for discharge home. I have emphasized the importance of follow-up with her PCP to arrange for outpatient stress test if her symptoms continue. Return precautions including any worsening symptoms or new symptoms reviewed. Patient voiced understanding and was discharged in satisfactory condition.   Ambrose Finland  Romana Deaton, MD 08/05/15 1314

## 2015-08-05 NOTE — ED Notes (Signed)
Pt sts she woke up this am with pressure on her chest and the feeling as if she couldn't catch her breath. sts she sat on the bed and had trouble getting her breath so she got on the treadmill and it fell better. sts currently it is better but still slightly there. Denise cough, fever.

## 2017-07-21 IMAGING — DX DG CHEST 2V
2 series · 2 of 2 positions shown · non-contrast
Comparison: 07/21/2008

CLINICAL DATA: Chest pain

EXAM:
CHEST  2 VIEW

[w chest pa]
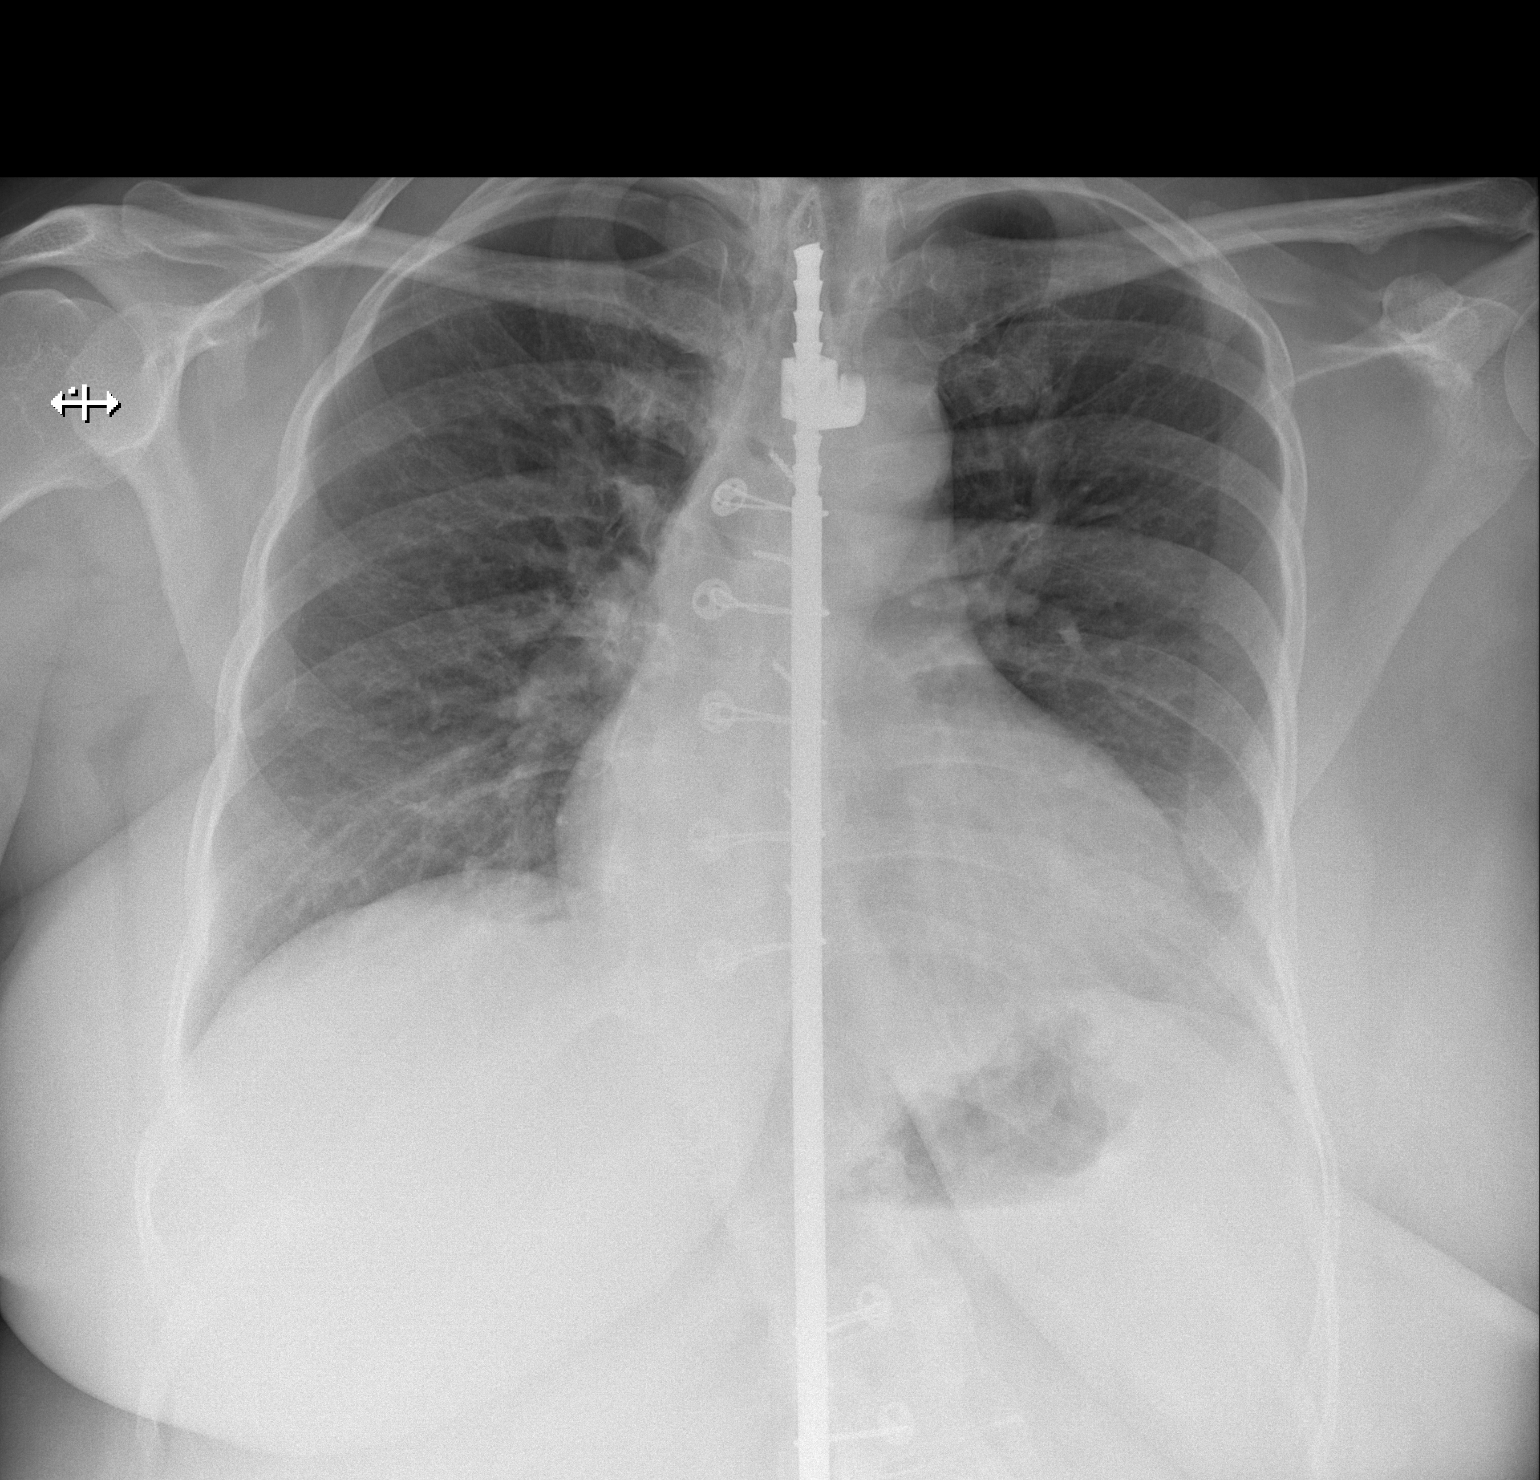

[w chest lat]
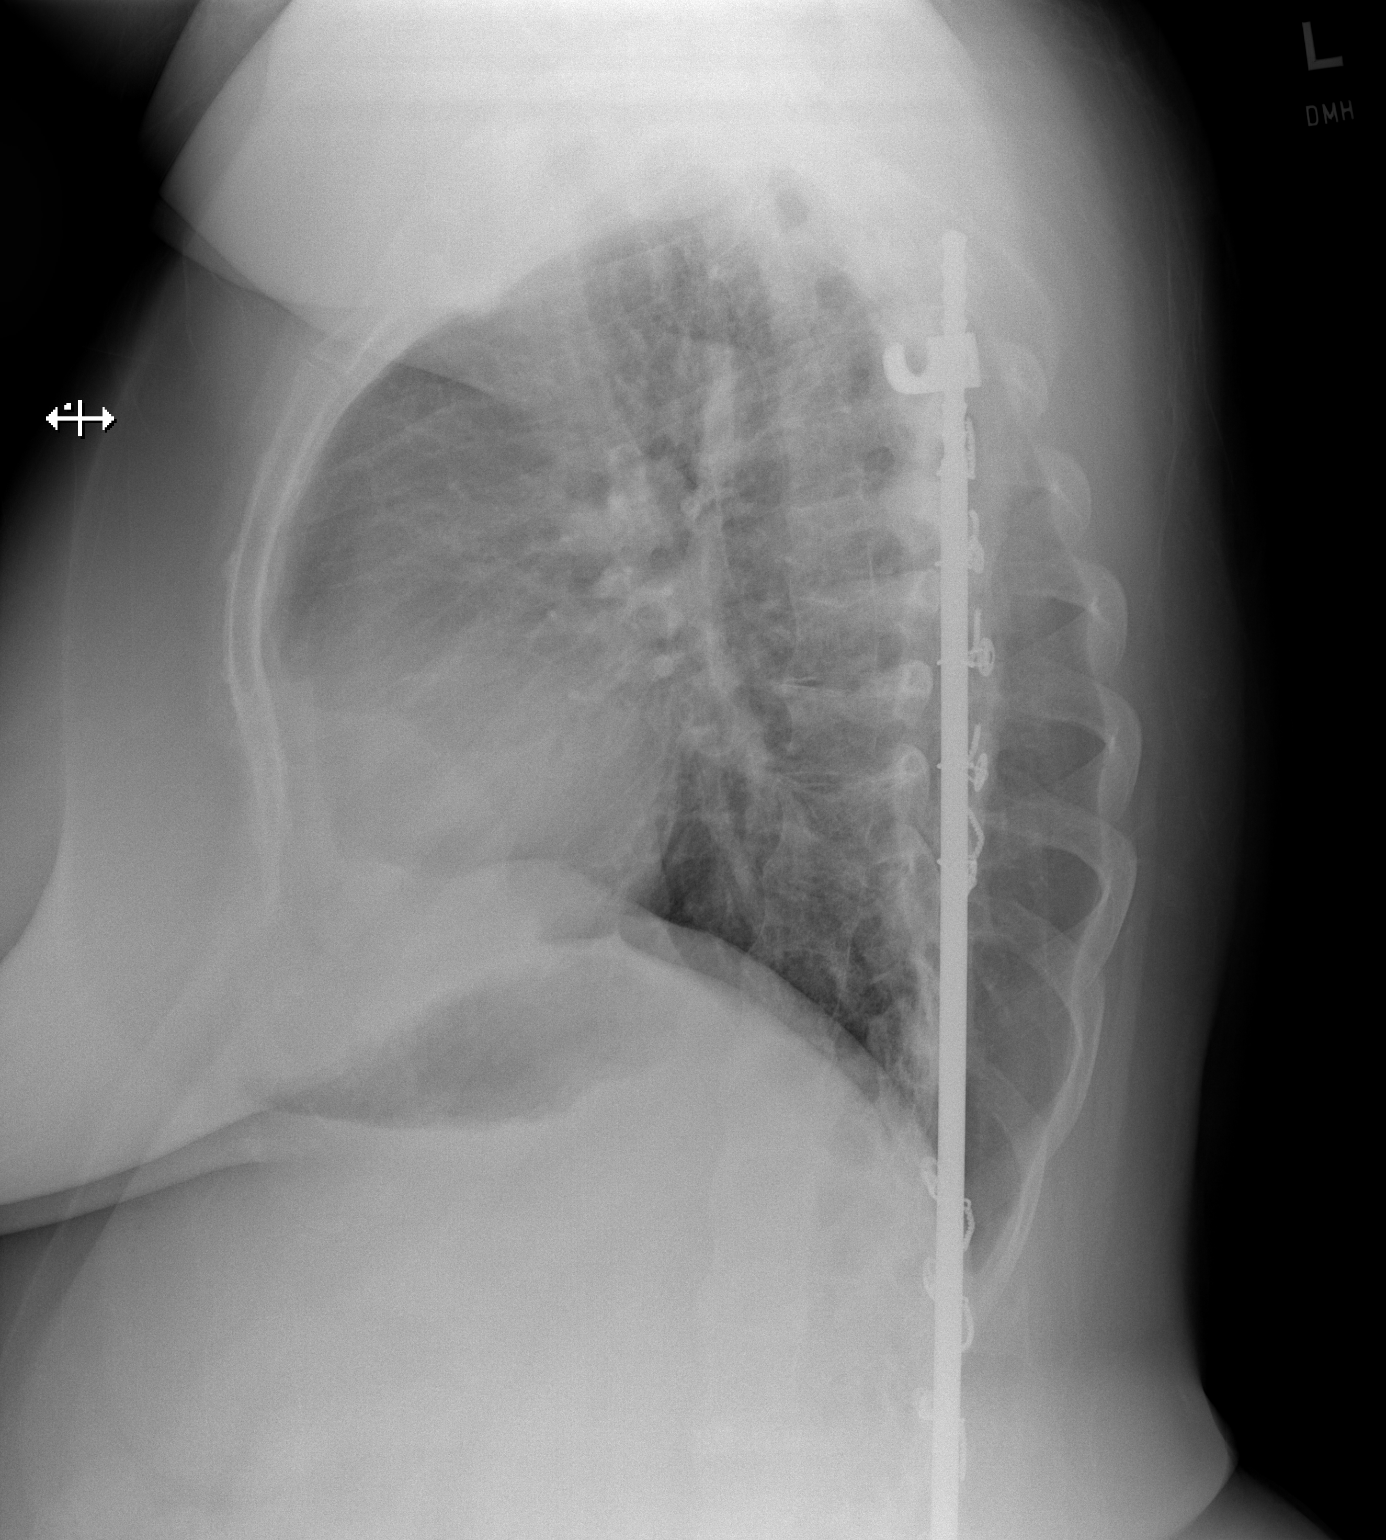

[2 of 2 positions shown; findings below may reference images not displayed]

FINDINGS: Borderline cardiomegaly, with size likely accentuated by scoliotic
curvature. Normal aortic and hilar contours. There is no edema,
consolidation, effusion, or pneumothorax. Thoracic scoliosis status
post fixation.
IMPRESSION: Borderline cardiomegaly.

## 2019-03-05 ENCOUNTER — Other Ambulatory Visit: Payer: Self-pay | Admitting: Orthopedic Surgery

## 2019-03-05 DIAGNOSIS — M545 Low back pain, unspecified: Secondary | ICD-10-CM

## 2019-04-27 ENCOUNTER — Other Ambulatory Visit: Payer: 59

## 2023-04-29 ENCOUNTER — Other Ambulatory Visit: Payer: Self-pay | Admitting: Surgery

## 2023-05-17 ENCOUNTER — Encounter (HOSPITAL_BASED_OUTPATIENT_CLINIC_OR_DEPARTMENT_OTHER): Payer: Self-pay | Admitting: Surgery

## 2023-05-17 ENCOUNTER — Other Ambulatory Visit: Payer: Self-pay

## 2023-05-24 ENCOUNTER — Encounter (HOSPITAL_BASED_OUTPATIENT_CLINIC_OR_DEPARTMENT_OTHER)
Admission: RE | Admit: 2023-05-24 | Discharge: 2023-05-24 | Disposition: A | Discharge status: Home / Self Care | Payer: BC Managed Care – PPO | Source: Ambulatory Visit | Attending: Surgery | Admitting: Surgery

## 2023-05-24 DIAGNOSIS — Z01812 Encounter for preprocedural laboratory examination: Secondary | ICD-10-CM | POA: Diagnosis present

## 2023-05-24 LAB — BASIC METABOLIC PANEL
Anion gap: 12 (ref 5–15)
BUN: 12 mg/dL (ref 6–20)
CO2: 22 mmol/L (ref 22–32)
Calcium: 9.7 mg/dL (ref 8.9–10.3)
Chloride: 107 mmol/L (ref 98–111)
Creatinine, Ser: 0.71 mg/dL (ref 0.44–1.00)
GFR, Estimated: >60 mL/min (ref >60)
Glucose, Bld: 157 mg/dL — ABNORMAL HIGH (ref 70–99)
Potassium: 3.8 mmol/L (ref 3.5–5.1)
Sodium: 141 mmol/L (ref 135–145)

## 2023-05-24 MED ORDER — CHLORHEXIDINE GLUCONATE CLOTH 2 % EX PADS: 6.0000 | MEDICATED_PAD | Freq: Once | CUTANEOUS | Status: DC

## 2023-05-24 MED ORDER — ENSURE PRE-SURGERY PO LIQD: 296.0000 mL | Freq: Once | ORAL | Status: DC

## 2023-05-27 NOTE — H&P (Signed)
REFERRING PHYSICIAN: Rhoderick Moody, MD PROVIDER: Wayne Both, MD MRN: [id number] DOB: 25-Feb-1971   Subjective   Chief Complaint: Breast abscess  History of Present Illness: Emily Fischer is a 52 y.o. female who is seen today as an office consultation for evaluation of Breast abscess  This is a 52 year old female referred here for evaluation of a chronic infected cyst on her right breast. She reports she had a small blackhead in the upper outer quadrant of the right breast for many years but it recent became infected and then spontaneously ruptured. She been on 3 courses of antibiotics and has had some improvement in the area with less discomfort and no more drainage. She has had no previous problems regarding her breast and denies nipple discharge. Her mammograms in February of this year were unremarkable. She is otherwise healthy without complaints  Review of Systems: A complete review of systems was obtained from the patient. I have reviewed this information and discussed as appropriate with the patient. See HPI as well for other ROS.  ROS   Medical History: Past Medical History:  Diagnosis Date  Diabetes mellitus without complication (CMS/HHS-HCC)  Hypertension   There is no problem list on file for this patient.  Past Surgical History:  Procedure Laterality Date  LAPAROSCOPIC TUBAL LIGATION 07/1999    Allergies  Allergen Reactions  Sulfa (Sulfonamide Antibiotics) Hives and Rash   Current Outpatient Medications on File Prior to Visit  Medication Sig Dispense Refill  atenoloL (TENORMIN) 100 MG tablet Take 100 mg by mouth once daily  OZEMPIC 0.25 mg or 0.5 mg (2 mg/3 mL) pen injector INJECT 0.5 MG EVERY WEEK BY SUBCUTANEIUS ROUTE  pantoprazole (PROTONIX) 40 MG DR tablet Take 40 mg by mouth every morning  telmisartan (MICARDIS) 40 MG tablet Take 40 mg by mouth every morning  valACYclovir (VALTREX) 1000 MG tablet Take 1,000 mg by mouth once daily   cholecalciferol (VITAMIN D3) 5,000 unit capsule Take 1 capsule by mouth once daily  ferrous sulfate 325 (65 FE) MG EC tablet Take by mouth   No current facility-administered medications on file prior to visit.   Family History  Problem Relation Age of Onset  High blood pressure (Hypertension) Mother  Hyperlipidemia (Elevated cholesterol) Mother  Diabetes Mother  High blood pressure (Hypertension) Sister    Social History   Tobacco Use  Smoking Status Never  Smokeless Tobacco Never    Social History   Socioeconomic History  Marital status: Married  Tobacco Use  Smoking status: Never  Smokeless tobacco: Never  Substance and Sexual Activity  Alcohol use: Never  Drug use: Never   Social Drivers of Health   Received from [employer]  Social Network   Objective:   Vitals:   BP: 138/82  Pulse: 103  Temp: 36.4 C (97.5 F)  SpO2: 96%  Weight: (!) 114.9 kg (253 lb 3.2 oz)  Height: 160 cm (5\' 3" )  PainSc: 2   Body mass index is 44.85 kg/m.  Physical Exam   She appears well on exam  There is a 1.5 cm cyst in the right breast in the upper outer quadrant with surrounding chronic induration as well as a small opening in the skin with no drainage.  There is no axillary adenopathy  Nipple areolar complex is normal  Labs, Imaging and Diagnostic Testing: I reviewed her notes in the electronic medical records as well as her mammogram  Assessment and Plan:   Diagnoses and all orders for this visit:  Infected sebaceous  cyst of skin   This is likely a chronically infected sebaceous cyst of the breast. Surgical excision of the 1 cm upper outer quadrant right breast this is strongly recommended prevent ongoing infections and for complete histologic evaluation to rule out malignancy. I explained the reasonings for this with her and her husband in detail. I described the surgical procedure in detail. We discussed the risks which includes but are not limited to  bleeding, ongoing infection, recurrence of the cyst, the need for further procedures if malignancy is found, postoperative recovery, cardiopulmonary issues, etc. They understand and wish to proceed with surgery which will be scheduled

## 2023-05-28 ENCOUNTER — Ambulatory Visit (HOSPITAL_BASED_OUTPATIENT_CLINIC_OR_DEPARTMENT_OTHER)
Admission: RE | Admit: 2023-05-28 | Discharge: 2023-05-28 | Disposition: A | Discharge status: Home / Self Care | Payer: BC Managed Care – PPO | Attending: Surgery | Admitting: Surgery

## 2023-05-28 ENCOUNTER — Other Ambulatory Visit: Payer: Self-pay

## 2023-05-28 ENCOUNTER — Ambulatory Visit (HOSPITAL_BASED_OUTPATIENT_CLINIC_OR_DEPARTMENT_OTHER): Payer: BC Managed Care – PPO | Admitting: Anesthesiology

## 2023-05-28 ENCOUNTER — Encounter (HOSPITAL_BASED_OUTPATIENT_CLINIC_OR_DEPARTMENT_OTHER)
Admission: RE | Disposition: A | Discharge status: Home / Self Care | Payer: Self-pay | Source: Home / Self Care | Attending: Surgery

## 2023-05-28 ENCOUNTER — Encounter (HOSPITAL_BASED_OUTPATIENT_CLINIC_OR_DEPARTMENT_OTHER): Payer: Self-pay | Admitting: Surgery

## 2023-05-28 DIAGNOSIS — Z6841 Body Mass Index (BMI) 40.0 and over, adult: Secondary | ICD-10-CM | POA: Diagnosis not present

## 2023-05-28 DIAGNOSIS — K219 Gastro-esophageal reflux disease without esophagitis: Secondary | ICD-10-CM | POA: Diagnosis not present

## 2023-05-28 DIAGNOSIS — N6081 Other benign mammary dysplasias of right breast: Secondary | ICD-10-CM | POA: Diagnosis present

## 2023-05-28 DIAGNOSIS — I10 Essential (primary) hypertension: Secondary | ICD-10-CM | POA: Diagnosis not present

## 2023-05-28 DIAGNOSIS — Z7985 Long-term (current) use of injectable non-insulin antidiabetic drugs: Secondary | ICD-10-CM | POA: Diagnosis not present

## 2023-05-28 DIAGNOSIS — L720 Epidermal cyst: Secondary | ICD-10-CM | POA: Diagnosis not present

## 2023-05-28 DIAGNOSIS — E119 Type 2 diabetes mellitus without complications: Secondary | ICD-10-CM | POA: Insufficient documentation

## 2023-05-28 DIAGNOSIS — Z01818 Encounter for other preprocedural examination: Secondary | ICD-10-CM

## 2023-05-28 HISTORY — DX: Atrial premature depolarization: I49.1

## 2023-05-28 HISTORY — DX: Type 2 diabetes mellitus without complications: E11.9

## 2023-05-28 HISTORY — PX: BREAST CYST EXCISION: SHX579

## 2023-05-28 HISTORY — DX: Gastro-esophageal reflux disease without esophagitis: K21.9

## 2023-05-28 LAB — GLUCOSE, CAPILLARY
Glucose-Capillary: 117 mg/dL — ABNORMAL HIGH (ref 70–99)
Glucose-Capillary: 124 mg/dL — ABNORMAL HIGH (ref 70–99)

## 2023-05-28 LAB — POCT PREGNANCY, URINE: Preg Test, Ur: NEGATIVE

## 2023-05-28 SURGERY — EXCISION, CYST, BREAST
Anesthesia: General | Site: Breast | Laterality: Right

## 2023-05-28 MED ORDER — BUPIVACAINE-EPINEPHRINE 0.5% -1:200000 IJ SOLN
INTRAMUSCULAR | Status: DC | PRN
  Administered 2023-05-28: 10 mL

## 2023-05-28 MED ORDER — TRAMADOL HCL 50 MG PO TABS: 50.0000 mg | ORAL_TABLET | Freq: Four times a day (QID) | ORAL | 0 refills | Status: AC | PRN

## 2023-05-28 MED ORDER — SODIUM CHLORIDE 0.9 % IV SOLN: 12.5000 mg | INTRAVENOUS | Status: DC | PRN

## 2023-05-28 MED ORDER — DEXAMETHASONE SODIUM PHOSPHATE 10 MG/ML IJ SOLN
INTRAMUSCULAR | Status: AC
  Filled 2023-05-28: qty 1

## 2023-05-28 MED ORDER — CEFAZOLIN SODIUM-DEXTROSE 2-4 GM/100ML-% IV SOLN: 2.0000 g | INTRAVENOUS | Status: DC

## 2023-05-28 MED ORDER — MIDAZOLAM HCL 2 MG/2ML IJ SOLN
INTRAMUSCULAR | Status: AC
Start: 2023-05-28 — End: ?
  Filled 2023-05-28: qty 2

## 2023-05-28 MED ORDER — ACETAMINOPHEN 500 MG PO TABS
ORAL_TABLET | ORAL | Status: AC
  Filled 2023-05-28: qty 2

## 2023-05-28 MED ORDER — LIDOCAINE 2% (20 MG/ML) 5 ML SYRINGE
INTRAMUSCULAR | Status: AC
  Filled 2023-05-28: qty 5

## 2023-05-28 MED ORDER — DEXAMETHASONE SODIUM PHOSPHATE 10 MG/ML IJ SOLN
INTRAMUSCULAR | Status: DC | PRN
  Administered 2023-05-28: 5 mg via INTRAVENOUS

## 2023-05-28 MED ORDER — PROPOFOL 10 MG/ML IV BOLUS
INTRAVENOUS | Status: AC
  Filled 2023-05-28: qty 20

## 2023-05-28 MED ORDER — OXYCODONE HCL 5 MG PO TABS: 5.0000 mg | ORAL_TABLET | Freq: Once | ORAL | Status: DC | PRN

## 2023-05-28 MED ORDER — LACTATED RINGERS IV SOLN: INTRAVENOUS | Status: DC

## 2023-05-28 MED ORDER — PROPOFOL 10 MG/ML IV BOLUS
INTRAVENOUS | Status: DC | PRN
  Administered 2023-05-28: 200 mg via INTRAVENOUS

## 2023-05-28 MED ORDER — CEFAZOLIN SODIUM-DEXTROSE 2-4 GM/100ML-% IV SOLN
INTRAVENOUS | Status: AC
  Filled 2023-05-28: qty 100

## 2023-05-28 MED ORDER — FENTANYL CITRATE (PF) 100 MCG/2ML IJ SOLN
INTRAMUSCULAR | Status: DC | PRN
  Administered 2023-05-28: 50 ug via INTRAVENOUS

## 2023-05-28 MED ORDER — FENTANYL CITRATE (PF) 100 MCG/2ML IJ SOLN: 25.0000 ug | INTRAMUSCULAR | Status: DC | PRN

## 2023-05-28 MED ORDER — ONDANSETRON HCL 4 MG/2ML IJ SOLN
INTRAMUSCULAR | Status: DC | PRN
  Administered 2023-05-28: 4 mg via INTRAVENOUS

## 2023-05-28 MED ORDER — OXYCODONE HCL 5 MG/5ML PO SOLN: 5.0000 mg | Freq: Once | ORAL | Status: DC | PRN

## 2023-05-28 MED ORDER — AMISULPRIDE (ANTIEMETIC) 5 MG/2ML IV SOLN: 10.0000 mg | Freq: Once | INTRAVENOUS | Status: DC | PRN

## 2023-05-28 MED ORDER — LACTATED RINGERS IV SOLN: INTRAVENOUS | Status: DC | PRN

## 2023-05-28 MED ORDER — ACETAMINOPHEN 500 MG PO TABS
1000.0000 mg | ORAL_TABLET | ORAL | Status: AC
Start: 2023-05-28 — End: 2023-05-28
  Administered 2023-05-28: 1000 mg via ORAL

## 2023-05-28 MED ORDER — FENTANYL CITRATE (PF) 100 MCG/2ML IJ SOLN
INTRAMUSCULAR | Status: AC
  Filled 2023-05-28: qty 2

## 2023-05-28 MED ORDER — LIDOCAINE HCL (CARDIAC) PF 100 MG/5ML IV SOSY
PREFILLED_SYRINGE | INTRAVENOUS | Status: DC | PRN
  Administered 2023-05-28: 60 mg via INTRAVENOUS

## 2023-05-28 MED ORDER — CEFAZOLIN SODIUM-DEXTROSE 2-3 GM-%(50ML) IV SOLR
INTRAVENOUS | Status: DC | PRN
  Administered 2023-05-28: 2 g via INTRAVENOUS

## 2023-05-28 MED ORDER — ONDANSETRON HCL 4 MG/2ML IJ SOLN
INTRAMUSCULAR | Status: AC
  Filled 2023-05-28: qty 2

## 2023-05-28 MED ORDER — MIDAZOLAM HCL 2 MG/2ML IJ SOLN
INTRAMUSCULAR | Status: DC | PRN
  Administered 2023-05-28: 2 mg via INTRAVENOUS

## 2023-05-28 SURGICAL SUPPLY — 39 items
ADH SKN CLS APL DERMABOND .7 (GAUZE/BANDAGES/DRESSINGS) ×1
APL PRP STRL LF DISP 70% ISPRP (MISCELLANEOUS) ×1
BLADE SURG 15 STRL LF DISP TIS (BLADE) ×1 IMPLANT
BLADE SURG 15 STRL SS (BLADE) ×1
CANISTER SUCT 1200ML W/VALVE (MISCELLANEOUS) IMPLANT
CHLORAPREP W/TINT 26 (MISCELLANEOUS) ×1 IMPLANT
COVER BACK TABLE 60X90IN (DRAPES) ×1 IMPLANT
COVER MAYO STAND STRL (DRAPES) ×1 IMPLANT
DERMABOND ADVANCED .7 DNX12 (GAUZE/BANDAGES/DRESSINGS) ×1 IMPLANT
DRAPE LAPAROTOMY 100X72 PEDS (DRAPES) ×1 IMPLANT
DRAPE UTILITY XL STRL (DRAPES) ×1 IMPLANT
ELECT REM PT RETURN 9FT ADLT (ELECTROSURGICAL) ×1
ELECTRODE REM PT RTRN 9FT ADLT (ELECTROSURGICAL) ×1 IMPLANT
GAUZE SPONGE 4X4 12PLY STRL LF (GAUZE/BANDAGES/DRESSINGS) ×1 IMPLANT
GLOVE BIOGEL PI IND STRL 7.5 (GLOVE) IMPLANT
GLOVE SURG SIGNA 7.5 PF LTX (GLOVE) ×1 IMPLANT
GLOVE SURG SS PI 7.0 STRL IVOR (GLOVE) IMPLANT
GOWN STRL REUS W/ TWL LRG LVL3 (GOWN DISPOSABLE) ×1 IMPLANT
GOWN STRL REUS W/ TWL XL LVL3 (GOWN DISPOSABLE) ×1 IMPLANT
GOWN STRL REUS W/TWL LRG LVL3 (GOWN DISPOSABLE) ×1
GOWN STRL REUS W/TWL XL LVL3 (GOWN DISPOSABLE) ×1
KIT MARKER MARGIN INK (KITS) ×1 IMPLANT
NDL HYPO 25X1 1.5 SAFETY (NEEDLE) ×1 IMPLANT
NEEDLE HYPO 25X1 1.5 SAFETY (NEEDLE) ×1
NS IRRIG 1000ML POUR BTL (IV SOLUTION) ×1 IMPLANT
PACK BASIN DAY SURGERY FS (CUSTOM PROCEDURE TRAY) ×1 IMPLANT
PENCIL SMOKE EVACUATOR (MISCELLANEOUS) ×1 IMPLANT
SLEEVE SCD COMPRESS KNEE MED (STOCKING) IMPLANT
SPIKE FLUID TRANSFER (MISCELLANEOUS) IMPLANT
SPONGE T-LAP 4X18 ~~LOC~~+RFID (SPONGE) ×1 IMPLANT
SUT MNCRL AB 4-0 PS2 18 (SUTURE) ×1 IMPLANT
SUT SILK 2 0 SH (SUTURE) ×1 IMPLANT
SUT VIC AB 3-0 SH 27 (SUTURE) ×1
SUT VIC AB 3-0 SH 27X BRD (SUTURE) ×1 IMPLANT
SYR CONTROL 10ML LL (SYRINGE) ×1 IMPLANT
TOWEL GREEN STERILE FF (TOWEL DISPOSABLE) ×1 IMPLANT
TRAY FAXITRON CT DISP (TRAY / TRAY PROCEDURE) IMPLANT
TUBE CONNECTING 20X1/4 (TUBING) IMPLANT
YANKAUER SUCT BULB TIP NO VENT (SUCTIONS) IMPLANT

## 2023-05-28 NOTE — Op Note (Signed)
EXCISION RIGHT BREAST CHRONIC SEBACEOUS CYST  Procedure Note  ESM… FLAMMER 05/28/2023   Pre-op Diagnosis: CHRONICALLY INFECTED RIGHT BREAST SEBACEOUS CYST     Post-op Diagnosis: same  Procedure(s): EXCISION RIGHT BREAST CHRONIC SEBACEOUS CYST (1.5 cm)  Surgeon(s): Abigail Miyamoto, MD  Anesthesia: Choice  Staff:  Circulator: Maryan Rued, RN Scrub Person: Paulita Fujita A  Estimated Blood Loss: Minimal               Specimens: Sent to path  Indications: This is a 52 year old female who is presented with a chronically infected sebaceous cyst on her right breast.  She had to have drainage of the cyst performed multiple bouts of antibiotics to help it resolve.  She now presents for excision of the chronic cyst to prevent further infections  Findings: There was no active visible infection with a sebaceous cyst which measured 1.5 cm.  It was sent to pathology for evaluation  Procedure: The patient was brought to the operating identifies correct patient.  She was placed upon the operating table and general anesthesia was induced.  Her right breast was prepped and draped in usual sterile fashion.  I anesthetized the skin around the area of the chronic cyst with Marcaine in the upper outer quadrant of the right breast.  I then performed an elliptical incision with a scalpel.  I then dissected down circumferentially with electrocautery and excised the cyst and surrounding skin and tissue back to healthy breast tissue circumferentially.  Once the cyst was removed it was sent to pathology for evaluation.  I achieved hemostasis with the cautery.  Again there was no further visible cyst or cyst capsule.  I then closed the subcutaneous tissue with interrupted 3-0 Vicryl sutures and closed the skin with a running 4-0 Monocryl.  Dermabond was then applied.  The patient tolerated the procedure well.  All the counts were correct at the end of the procedure.  The patient was then extubated  in the operating room and taken in a stable condition to the recovery room.          Abigail Miyamoto   Date: 05/28/2023  Time: 8:49 AM

## 2023-05-28 NOTE — Transfer of Care (Signed)
Immediate Anesthesia Transfer of Care Note  Patient: Emily Fischer  Procedure(s) Performed: EXCISION RIGHT BREAST CHRONIC SEBACEOUS CYST (Right: Breast)  Patient Location: PACU  Anesthesia Type:General  Level of Consciousness: awake, alert , and patient cooperative  Airway & Oxygen Therapy: Patient Spontanous Breathing and Patient connected to face mask oxygen  Post-op Assessment: Report given to RN and Post -op Vital signs reviewed and stable  Post vital signs: Reviewed and stable  Last Vitals:  Vitals Value Taken Time  BP 156/90 05/28/23 0915  Temp 36.1 C 05/28/23 0853  Pulse 53 05/28/23 0923  Resp 16 05/28/23 0923  SpO2 95 % 05/28/23 0923  Vitals shown include unfiled device data.  Last Pain:  Vitals:   05/28/23 0853  TempSrc:   PainSc: Asleep         Complications: No notable events documented.

## 2023-05-28 NOTE — Anesthesia Postprocedure Evaluation (Signed)
Anesthesia Post Note  Patient: Emily Fischer  Procedure(s) Performed: EXCISION RIGHT BREAST CHRONIC SEBACEOUS CYST (Right: Breast)     Patient location during evaluation: PACU Anesthesia Type: General Level of consciousness: awake and alert Pain management: pain level controlled Vital Signs Assessment: post-procedure vital signs reviewed and stable Respiratory status: spontaneous breathing, nonlabored ventilation and respiratory function stable Cardiovascular status: stable and blood pressure returned to baseline Anesthetic complications: no   No notable events documented.  Last Vitals:  Vitals:   05/28/23 0930 05/28/23 1011  BP: (!) 166/92 (!) 153/84  Pulse: (!) 58 (!) 52  Resp: (!) 22 16  Temp:  (!) 36.2 C  SpO2: 94% 97%    Last Pain:  Vitals:   05/28/23 1011  TempSrc: Temporal  PainSc: 0-No pain                 Beryle Lathe

## 2023-05-28 NOTE — Interval H&P Note (Signed)
History and Physical Interval Note: no change in H and P  05/28/2023 7:15 AM  Emily Fischer  has presented today for surgery, with the diagnosis of CHRONICALLY INFECTED RIGHT BREAST SEBACEOUS CYST.  The various methods of treatment have been discussed with the patient and family. After consideration of risks, benefits and other options for treatment, the patient has consented to  Procedure(s): EXCISION RIGHT BREAST CHRONIC SEBACEOUS CYST (Right) as a surgical intervention.  The patient's history has been reviewed, patient examined, no change in status, stable for surgery.  I have reviewed the patient's chart and labs.  Questions were answered to the patient's satisfaction.     Abigail Miyamoto

## 2023-05-28 NOTE — Discharge Instructions (Addendum)
  Post Anesthesia Home Care Instructions  Activity: Get plenty of rest for the remainder of the day. A responsible individual must stay with you for 24 hours following the procedure.  For the next 24 hours, DO NOT: -Drive a car -Advertising copywriter -Drink alcoholic beverages -Take any medication unless instructed by your physician -Make any legal decisions or sign important papers.  Meals: Start with liquid foods such as gelatin or soup. Progress to regular foods as tolerated. Avoid greasy, spicy, heavy foods. If nausea and/or vomiting occur, drink only clear liquids until the nausea and/or vomiting subsides. Call your physician if vomiting continues.  Special Instructions/Symptoms: Your throat may feel dry or sore from the anesthesia or the breathing tube placed in your throat during surgery. If this causes discomfort, gargle with warm salt water. The discomfort should disappear within 24 hours.  If you had a scopolamine patch placed behind your ear for the management of post- operative nausea and/or vomiting:  1. The medication in the patch is effective for 72 hours, after which it should be removed.  Wrap patch in a tissue and discard in the trash. Wash hands thoroughly with soap and water. 2. You may remove the patch earlier than 72 hours if you experience unpleasant side effects which may include dry mouth, dizziness or visual disturbances. 3. Avoid touching the patch. Wash your hands with soap and water after contact with the patch.    Next dose of tylenol may be taken at 1p   You may shower starting tomorrow  Ice pack, tylenol, and ibuprofen also for pain  No vigorous activity for one week

## 2023-05-28 NOTE — Anesthesia Preprocedure Evaluation (Addendum)
Anesthesia Evaluation  Patient identified by MRN, date of birth, ID band Patient awake    Reviewed: Allergy & Precautions, NPO status , Patient's Chart, lab work & pertinent test results  History of Anesthesia Complications Negative for: history of anesthetic complications  Airway Mallampati: III  TM Distance: >3 FB Neck ROM: Full    Dental  (+) Dental Advisory Given, Chipped   Pulmonary neg pulmonary ROS   Pulmonary exam normal        Cardiovascular hypertension, Pt. on home beta blockers and Pt. 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   Reproductive/Obstetrics  s/p tubal ligation                              Anesthesia Physical Anesthesia Plan  ASA: 3  Anesthesia Plan: General   Post-op Pain Management: Tylenol PO (pre-op)*   Induction: Intravenous  PONV Risk Score and Plan: 3 and Treatment may vary due to age or medical condition, Ondansetron, Dexamethasone and Midazolam  Airway Management Planned: LMA  Additional Equipment: None  Intra-op Plan:   Post-operative Plan: Extubation in OR  Informed Consent: I have reviewed the patients History and Physical, chart, labs and discussed the procedure including the risks, benefits and alternatives for the proposed anesthesia with the patient or authorized representative who has indicated his/her understanding and acceptance.     Dental advisory given  Plan Discussed with: CRNA and Anesthesiologist  Anesthesia Plan Comments:         Anesthesia Quick Evaluation

## 2023-05-28 NOTE — Anesthesia Procedure Notes (Signed)
Procedure Name: LMA Insertion Date/Time: 05/28/2023 8:32 AM  Performed by: Karen Kitchens, CRNAPre-anesthesia Checklist: Patient identified, Emergency Drugs available, Suction available and Patient being monitored Patient Re-evaluated:Patient Re-evaluated prior to induction Oxygen Delivery Method: Circle system utilized Preoxygenation: Pre-oxygenation with 100% oxygen Induction Type: IV induction Ventilation: Mask ventilation without difficulty LMA: LMA inserted LMA Size: 4.0 Number of attempts: 1 Airway Equipment and Method: Bite block Placement Confirmation: positive ETCO2, CO2 detector and breath sounds checked- equal and bilateral Tube secured with: Tape Dental Injury: Teeth and Oropharynx as per pre-operative assessment

## 2023-05-29 ENCOUNTER — Encounter (HOSPITAL_BASED_OUTPATIENT_CLINIC_OR_DEPARTMENT_OTHER): Payer: Self-pay | Admitting: Surgery

## 2023-05-29 LAB — SURGICAL PATHOLOGY
